# Patient Record
Sex: Female | Born: 1955 | Race: Black or African American | Hispanic: No | Smoking: Never smoker
Health system: Southern US, Community
[De-identification: ages and names within clinical notes are randomized; demographics above are authoritative.]

## PROBLEM LIST (undated history)

## (undated) DIAGNOSIS — G4733 Obstructive sleep apnea (adult) (pediatric): Secondary | ICD-10-CM

## (undated) DIAGNOSIS — I10 Essential (primary) hypertension: Secondary | ICD-10-CM

## (undated) DIAGNOSIS — K219 Gastro-esophageal reflux disease without esophagitis: Secondary | ICD-10-CM

## (undated) HISTORY — DX: Obstructive sleep apnea (adult) (pediatric): G47.33

## (undated) HISTORY — PX: TONSILLECTOMY: SUR1361

## (undated) HISTORY — DX: Essential (primary) hypertension: I10

---

## 1997-07-09 ENCOUNTER — Emergency Department (HOSPITAL_COMMUNITY): Admission: EM | Admit: 1997-07-09 | Discharge: 1997-07-09 | Payer: Self-pay | Admitting: Emergency Medicine

## 1997-10-24 ENCOUNTER — Ambulatory Visit (HOSPITAL_COMMUNITY): Admission: RE | Admit: 1997-10-24 | Discharge: 1997-10-24 | Payer: Self-pay | Admitting: Family Medicine

## 1997-12-25 ENCOUNTER — Ambulatory Visit (HOSPITAL_COMMUNITY): Admission: RE | Admit: 1997-12-25 | Discharge: 1997-12-25 | Payer: Self-pay | Admitting: Gastroenterology

## 1998-10-30 ENCOUNTER — Encounter: Payer: Self-pay | Admitting: Family Medicine

## 1998-10-30 ENCOUNTER — Ambulatory Visit (HOSPITAL_COMMUNITY): Admission: RE | Admit: 1998-10-30 | Discharge: 1998-10-30 | Payer: Self-pay | Admitting: Family Medicine

## 1998-12-14 ENCOUNTER — Other Ambulatory Visit: Admission: RE | Admit: 1998-12-14 | Discharge: 1998-12-14 | Payer: Self-pay | Admitting: Family Medicine

## 1999-10-15 ENCOUNTER — Encounter: Payer: Self-pay | Admitting: Family Medicine

## 1999-10-15 ENCOUNTER — Encounter: Admission: RE | Admit: 1999-10-15 | Discharge: 1999-10-15 | Payer: Self-pay | Admitting: Family Medicine

## 1999-11-13 ENCOUNTER — Encounter: Payer: Self-pay | Admitting: Family Medicine

## 1999-11-13 ENCOUNTER — Ambulatory Visit (HOSPITAL_COMMUNITY): Admission: RE | Admit: 1999-11-13 | Discharge: 1999-11-13 | Payer: Self-pay | Admitting: Family Medicine

## 1999-12-24 ENCOUNTER — Other Ambulatory Visit: Admission: RE | Admit: 1999-12-24 | Discharge: 1999-12-24 | Payer: Self-pay | Admitting: Family Medicine

## 2000-11-18 ENCOUNTER — Ambulatory Visit (HOSPITAL_COMMUNITY): Admission: RE | Admit: 2000-11-18 | Discharge: 2000-11-18 | Payer: Self-pay | Admitting: Family Medicine

## 2000-11-18 ENCOUNTER — Encounter: Payer: Self-pay | Admitting: Family Medicine

## 2000-12-22 ENCOUNTER — Other Ambulatory Visit: Admission: RE | Admit: 2000-12-22 | Discharge: 2000-12-22 | Payer: Self-pay | Admitting: Family Medicine

## 2001-11-24 ENCOUNTER — Ambulatory Visit (HOSPITAL_COMMUNITY): Admission: RE | Admit: 2001-11-24 | Discharge: 2001-11-24 | Payer: Self-pay | Admitting: Family Medicine

## 2001-11-24 ENCOUNTER — Encounter: Payer: Self-pay | Admitting: Family Medicine

## 2002-03-17 ENCOUNTER — Other Ambulatory Visit: Admission: RE | Admit: 2002-03-17 | Discharge: 2002-03-17 | Payer: Self-pay | Admitting: Family Medicine

## 2002-10-07 ENCOUNTER — Encounter: Payer: Self-pay | Admitting: Gastroenterology

## 2002-10-07 ENCOUNTER — Encounter: Admission: RE | Admit: 2002-10-07 | Discharge: 2002-10-07 | Payer: Self-pay | Admitting: Gastroenterology

## 2002-12-01 ENCOUNTER — Ambulatory Visit (HOSPITAL_COMMUNITY): Admission: RE | Admit: 2002-12-01 | Discharge: 2002-12-01 | Payer: Self-pay | Admitting: Family Medicine

## 2003-05-19 ENCOUNTER — Other Ambulatory Visit: Admission: RE | Admit: 2003-05-19 | Discharge: 2003-05-19 | Payer: Self-pay | Admitting: Family Medicine

## 2003-06-02 ENCOUNTER — Emergency Department (HOSPITAL_COMMUNITY): Admission: EM | Admit: 2003-06-02 | Discharge: 2003-06-02 | Payer: Self-pay | Admitting: Family Medicine

## 2003-12-04 ENCOUNTER — Ambulatory Visit (HOSPITAL_COMMUNITY): Admission: RE | Admit: 2003-12-04 | Discharge: 2003-12-04 | Payer: Self-pay | Admitting: Family Medicine

## 2004-01-22 ENCOUNTER — Emergency Department (HOSPITAL_COMMUNITY): Admission: EM | Admit: 2004-01-22 | Discharge: 2004-01-22 | Payer: Self-pay | Admitting: Emergency Medicine

## 2004-03-23 ENCOUNTER — Emergency Department (HOSPITAL_COMMUNITY): Admission: EM | Admit: 2004-03-23 | Discharge: 2004-03-23 | Payer: Self-pay | Admitting: Emergency Medicine

## 2004-08-14 ENCOUNTER — Other Ambulatory Visit: Admission: RE | Admit: 2004-08-14 | Discharge: 2004-08-14 | Payer: Self-pay | Admitting: Family Medicine

## 2004-09-04 ENCOUNTER — Emergency Department (HOSPITAL_COMMUNITY): Admission: EM | Admit: 2004-09-04 | Discharge: 2004-09-04 | Payer: Self-pay | Admitting: Emergency Medicine

## 2004-12-19 ENCOUNTER — Ambulatory Visit (HOSPITAL_COMMUNITY): Admission: RE | Admit: 2004-12-19 | Discharge: 2004-12-19 | Payer: Self-pay | Admitting: Family Medicine

## 2005-01-24 ENCOUNTER — Encounter: Admission: RE | Admit: 2005-01-24 | Discharge: 2005-01-24 | Payer: Self-pay | Admitting: Family Medicine

## 2005-02-03 ENCOUNTER — Emergency Department (HOSPITAL_COMMUNITY): Admission: EM | Admit: 2005-02-03 | Discharge: 2005-02-03 | Payer: Self-pay | Admitting: Emergency Medicine

## 2005-09-05 ENCOUNTER — Encounter: Admission: RE | Admit: 2005-09-05 | Discharge: 2005-09-05 | Payer: Self-pay | Admitting: Family Medicine

## 2006-03-23 ENCOUNTER — Ambulatory Visit (HOSPITAL_COMMUNITY): Admission: RE | Admit: 2006-03-23 | Discharge: 2006-03-23 | Payer: Self-pay | Admitting: Family Medicine

## 2006-04-08 ENCOUNTER — Encounter: Admission: RE | Admit: 2006-04-08 | Discharge: 2006-04-08 | Payer: Self-pay

## 2006-08-28 ENCOUNTER — Emergency Department (HOSPITAL_COMMUNITY): Admission: EM | Admit: 2006-08-28 | Discharge: 2006-08-29 | Payer: Self-pay | Admitting: Emergency Medicine

## 2006-08-31 ENCOUNTER — Observation Stay (HOSPITAL_COMMUNITY): Admission: EM | Admit: 2006-08-31 | Discharge: 2006-09-01 | Payer: Self-pay | Admitting: Emergency Medicine

## 2006-08-31 ENCOUNTER — Encounter (INDEPENDENT_AMBULATORY_CARE_PROVIDER_SITE_OTHER): Payer: Self-pay | Admitting: Emergency Medicine

## 2007-03-26 ENCOUNTER — Ambulatory Visit (HOSPITAL_COMMUNITY): Admission: RE | Admit: 2007-03-26 | Discharge: 2007-03-26 | Payer: Self-pay | Admitting: *Deleted

## 2008-03-27 ENCOUNTER — Ambulatory Visit (HOSPITAL_COMMUNITY): Admission: RE | Admit: 2008-03-27 | Discharge: 2008-03-27 | Payer: Self-pay | Admitting: Family Medicine

## 2008-08-01 IMAGING — CR DG CHEST 1V PORT
1 series · 1 of 1 positions shown · non-contrast
Comparison: none

CLINICAL DATA: Chest pain.  Hypertension. 
 PORTABLE CHEST ? 1 VIEW ? 08/31/06:

[view not recorded]
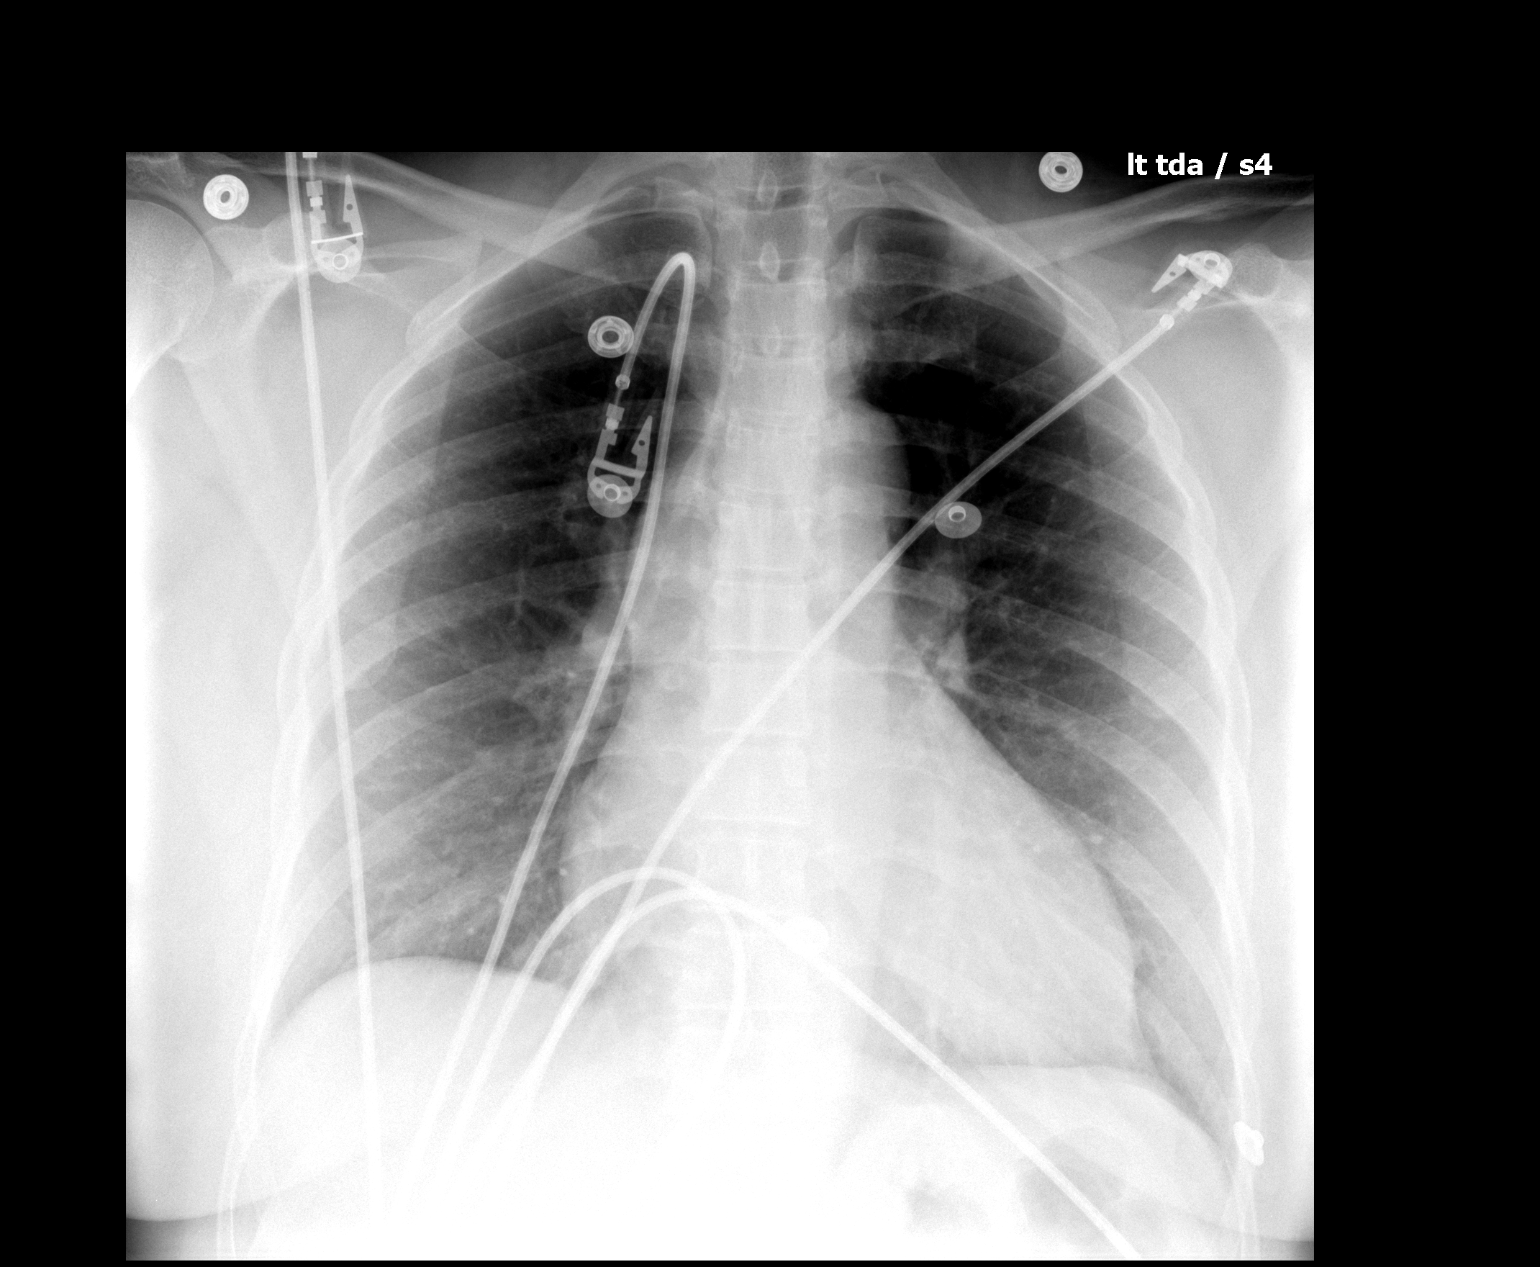

[1 of 1 positions shown; findings below may reference images not displayed]

FINDINGS: Heart size is normal considering AP portable technique.  Vascularity is normal and the lungs are clear.  No bony abnormality.
IMPRESSION: No acute disease.

## 2009-04-02 ENCOUNTER — Ambulatory Visit (HOSPITAL_COMMUNITY): Admission: RE | Admit: 2009-04-02 | Discharge: 2009-04-02 | Payer: Self-pay | Admitting: Family Medicine

## 2009-09-19 ENCOUNTER — Encounter: Admission: RE | Admit: 2009-09-19 | Discharge: 2009-09-19 | Payer: Self-pay | Admitting: Family Medicine

## 2010-03-26 ENCOUNTER — Other Ambulatory Visit (HOSPITAL_COMMUNITY): Payer: Self-pay | Admitting: Family Medicine

## 2010-03-26 DIAGNOSIS — Z1231 Encounter for screening mammogram for malignant neoplasm of breast: Secondary | ICD-10-CM

## 2010-04-04 ENCOUNTER — Ambulatory Visit (HOSPITAL_COMMUNITY): Payer: Self-pay

## 2010-04-05 ENCOUNTER — Ambulatory Visit (HOSPITAL_COMMUNITY): Payer: Self-pay

## 2010-04-05 ENCOUNTER — Ambulatory Visit (HOSPITAL_COMMUNITY)
Admission: RE | Admit: 2010-04-05 | Discharge: 2010-04-05 | Disposition: A | Discharge status: Home / Self Care | Payer: 59 | Source: Ambulatory Visit | Attending: Family Medicine | Admitting: Family Medicine

## 2010-04-05 DIAGNOSIS — Z1231 Encounter for screening mammogram for malignant neoplasm of breast: Secondary | ICD-10-CM | POA: Insufficient documentation

## 2010-06-18 NOTE — H&P (Signed)
[name], [name]             ACCOUNT NO.:  [id number]   MEDICAL RECORD NO.:  [id number]          PATIENT TYPE:  INP   LOCATION:  2039                         FACILITY:  [facility]   PHYSICIAN:  Hind I Elsaid, MD      DATE OF BIRTH:  1955-12-30   DATE OF ADMISSION:  08/31/2006  DATE OF DISCHARGE:                              HISTORY & PHYSICAL   PRIMARY CARE PHYSICIAN:  Dr. Smith Mince.   CHIEF COMPLAINT:  Chest pain.   HISTORY OF PRESENT ILLNESS:  A 55 year old African American female with  a history of uncontrolled blood pressure, with last visit 3 days ago  secondary to elevated blood pressures where the patient was evaluated by  the ED physician and recommended some medications, and was sent home.  The patient today with a main complaint of bilateral chest pain, mainly  on the right side with radiating to the right shoulder.  Pain 5/10,  popping in nature.  The patient denies any nausea or vomiting, any  sweating or palpitations with the chest pain.  The patient's pain  completely resolved while in the ED.  The patient denies any similar  episode of chest pain in the past.  Pain is not associated with  shortness of breath.  The patient denies any change in bowel habit.  Denies any abdominal pain or back pain.  Denies any lower extremity  weakness or numbness.  Condition was associated with headache.  Also,  the patient mentioned that she measured her blood pressure at home today  and it was elevated above 190, and she was referred to come to the  hospital for further evaluation of her blood pressure and chest pain.   PAST MEDICAL HISTORY:  1. History of hypertension.  2. Gastroesophageal reflux disease.  3. Allergic sinusitis.   MEDICATIONS:  1. Benicar 40/25 1 tab p.o. daily.  2. Potassium chloride 10 mEq p.o. daily.  3. Nexium 40 mg p.o. daily.  4. Nasonex 1 puff up the nose b.i.d.   ALLERGIES:  No known drug allergies.   PAST SURGICAL HISTORY:  C-section.   REVIEW  OF SYSTEMS:  Some episode of headache but not today.  No blurring  of vision.  No numbness or weakness of her  extremities.  Denies any  nausea or vomiting.  Denies any burning with urination.  Chest pain at  this time is completely resolved.   FAMILY HISTORY:  Positive for a massive heart attack of her mother at  age 47.  Father died because of emphysema and a history of hypertension.   SOCIAL HISTORY:  Lives by herself.  She is divorced, works at the court  house.  Has a daughter, age 43, and she is healthy.  A history of  excess smoking for more than 20 years ago.  She drinks wine, 1 glass  everyday, and no alcohol.   PHYSICAL EXAMINATION:  VITAL SIGNS:  Temperature 98.4, blood pressure  174/100, pulse rate 74, respiratory rate 18, and sating 96% on room air.  GENERAL:  The patient is alert and oriented, and not in respiratory  distress or shortness  of breath.  Not in pain.  HEENT:  Normocephalic, atraumatic.  Pupils equal and reactive to light  and accommodation.  Extraocular muscles movement was normal.  Throat was  normal.  NECK:  No carotid bruit, no JVD, and no thyromegaly.  CHEST:  Normal work of breathing.  Equal air entry.  Bilateral breaths  was normal.  There is no masses, and no area of tenderness. Nipples are  everted.  HEART:  S1 and S2 with no other sound.  No murmur, no gallop.  ABDOMEN:  Soft, nontender.  Bowel sounds positive, and no organomegaly.  EXTREMITIES:  Peripheral pulses intact, and there is no lower limb  edema, and there is no evidence of cyanosis.  BACK:  No area of point tenderness, and no sacral ulcers.  NEUROLOGIC:  The patient is alert and oriented x3 with no focal  neurological findings.   LABORATORY DATA:  Cardiac enzymes x1 was negative.  Sodium 140,  potassium 3.1, carbon dioxide 28, chloride 104, glucose 109, BUN 17,  creatinine 0.79, calcium 9.8.  White blood cells 6.6, hemoglobin 12.3,  hematocrit 37.6, platelets 187.  EKG:  Normal sinus  rhythm.   ASSESSMENT AND PLAN:  1. Chest pain:  It looks like atypical chest pain on presentation.  We      will admit the patient to telemetry.  Cycle enzymes.  2-D echo, and      repeat EKG.  The patient may need to have a stress test as an      outpatient if the above was negative.  Chest pain could be related      to muscular, or could be related to the hypertension.  We will      order a chest x-ray for further evaluation of the chest.  2. Uncontrolled hypertension:  We will add beta blockers to the above      medications, and further recommendation as inpatient in the      hospital.  Deep venous thrombosis and gastrointestinal prophylaxis.      Hind Bosie Helper, MD  Electronically Signed     HIE/MEDQ  D:  08/31/2006  T:  08/31/2006  Job:  [id number]

## 2010-06-18 NOTE — Discharge Summary (Signed)
NAMELADONNA, [name]             ACCOUNT NO.:  [id number]   MEDICAL RECORD NO.:  [id number]          PATIENT TYPE:  INP   LOCATION:  2039                         FACILITY:  [facility]   PHYSICIAN:  Ladell Pier, M.D.   DATE OF BIRTH:  July 25, 1955   DATE OF ADMISSION:  08/31/2006  DATE OF DISCHARGE:  09/01/2006                               DISCHARGE SUMMARY   DISCHARGE DIAGNOSES:  1. Atypical chest pain with negative cardiac enzymes, normal EKG,      negative D-dimer.  2. Hypertension.  3. Gastroesophageal reflux disease.  4. Allergic rhinitis.   DISCHARGE MEDICATIONS:  1. Avapro 300 mg daily.  2. Hydrochlorothiazide 25 mg daily.  3. Nexium 40 mg daily.  4. Nasonex one puff b.i.d.  5. Xyzal daily.   FOLLOW-UP APPOINTMENTS:  The patient has an appointment for a treadmill  stress test with Dr. Katrinka Blazing on September 09, 2006, at 8:30, and follow up  with Dr. Smith Mince in 1 week.   CONSULTANTS:  None.   PROCEDURES:  None.   HISTORY OF PRESENT ILLNESS:  The patient is a 51-year African American  female, history of uncontrolled blood pressure with last visit three  days ago secondary to elevated blood pressure in the emergency room. ED  recommended some medications and the patient was sent home.  She came  back complaining of bilateral chest pain on the right side radiating  down to the right shoulder.  Please see remainder of H&P for history,  past medical history, family history, social history, meds, allergies,  review of systems per admission H&P.   PHYSICAL EXAMINATION ON DISCHARGE:  VITAL SIGNS:  Temperature 97.2,  pulse 72, respirations 20, blood pressure 125/82, pulse ox 97% on room  air.  HEENT:  Head is normocephalic, atraumatic.  Pupils reactive to light.  Throat without erythema.  CARDIOVASCULAR:  Regular rate and rhythm.  LUNGS:  Clear bilaterally.  ABDOMEN:  Positive bowel sounds.  EXTREMITIES:  Without edema.   HOSPITAL COURSE:  PROBLEM #1 - CHEST PAIN:  The  patient was admitted to  the hospital.  Her enzymes were cycled.  She had an EKG done.  They were  normal.  She also had D-dimer checked that was normal.  Thyroid function  check that was normal.  The patient was discharged home.  She will  follow up with her primary care physician.  She had a 2-D echo done; at  the time of the discharge, it was still pending.  She also has an  appointment to follow up for a treadmill stress test with Dr. Halina Andreas [facility] on September 09, 2006, at 8:30 a.m.   PROBLEM #2 - HYPERTENSION:  The patient was on  Benicar/hydrochlorothiazide.  She was on Avapro/hydrochlorothiazide in  the hospital secondary to the hospital formulary.  She preferred to be  on the Avapro so I gave her a prescription for Avapro and the  hydrochlorothiazide.  Blood pressure on discharge was 125/82.   DISCHARGE LABORATORY DATA:  Total cholesterol 191, triglycerides of 119,  HDL 51, LDL 116.  Cardiac enzymes all normal.  Sodium 138, potassium  3.5, chloride 101, CO2 29, glucose 102, BUN 13, creatinine 0.79.  TSH  1.137.  Hemoglobin A1c 6.  D-dimer 0.33.   Chest x-ray showed no acute disease.      Ladell Pier, M.D.  Electronically Signed     NJ/MEDQ  D:  09/01/2006  T:  09/02/2006  Job:  [id number]   cc:   Talmadge Coventry, M.D.  Lyn Records, M.D.

## 2010-11-18 LAB — CK TOTAL AND CKMB (NOT AT ARMC)
CK, MB: 3.1
CK, MB: 3.7
Relative Index: 2
Relative Index: INVALID
Total CK: 153
Total CK: 177

## 2010-11-18 LAB — BASIC METABOLIC PANEL
CO2: 28
Calcium: 9.7
Chloride: 104
GFR calc Af Amer: >60
GFR calc non Af Amer: >60
GFR calc non Af Amer: >60
Glucose, Bld: 109 — ABNORMAL HIGH
Potassium: 3.1 — ABNORMAL LOW
Potassium: 3.5
Sodium: 138
Sodium: 140

## 2010-11-18 LAB — CBC
HCT: 37.6
Hemoglobin: 12.3
RDW: 14.5 — ABNORMAL HIGH

## 2010-11-18 LAB — COMPREHENSIVE METABOLIC PANEL
ALT: 19
AST: 20
Alkaline Phosphatase: 122 — ABNORMAL HIGH
Calcium: 9.3
GFR calc Af Amer: >60
Potassium: 3.7
Sodium: 136
Total Protein: 7.3

## 2010-11-18 LAB — POCT CARDIAC MARKERS
CKMB, poc: 1
CKMB, poc: <1 — ABNORMAL LOW
Myoglobin, poc: 42
Troponin i, poc: <0.05

## 2010-11-18 LAB — DIFFERENTIAL
Basophils Absolute: 0
Eosinophils Relative: 1
Lymphs Abs: 1.7
Monocytes Relative: 11
Neutro Abs: 4.1

## 2010-11-18 LAB — TROPONIN I: Troponin I: 0.01

## 2010-11-18 LAB — LIPID PANEL
HDL: 51
Total CHOL/HDL Ratio: 3.7
VLDL: 24

## 2010-11-18 LAB — B-NATRIURETIC PEPTIDE (CONVERTED LAB): Pro B Natriuretic peptide (BNP): 33

## 2010-11-18 LAB — PHOSPHORUS: Phosphorus: 3.7

## 2010-11-18 LAB — HEMOGLOBIN A1C: Hgb A1c MFr Bld: 6

## 2010-11-18 LAB — MAGNESIUM: Magnesium: 2

## 2011-03-01 ENCOUNTER — Emergency Department (HOSPITAL_COMMUNITY)
Admission: EM | Admit: 2011-03-01 | Discharge: 2011-03-01 | Disposition: A | Discharge status: Home / Self Care | Payer: 59 | Attending: Emergency Medicine | Admitting: Emergency Medicine

## 2011-03-01 ENCOUNTER — Encounter (HOSPITAL_COMMUNITY): Payer: Self-pay | Admitting: Physical Medicine and Rehabilitation

## 2011-03-01 ENCOUNTER — Emergency Department (HOSPITAL_COMMUNITY): Payer: 59

## 2011-03-01 ENCOUNTER — Other Ambulatory Visit: Payer: Self-pay

## 2011-03-01 DIAGNOSIS — R079 Chest pain, unspecified: Secondary | ICD-10-CM | POA: Insufficient documentation

## 2011-03-01 DIAGNOSIS — R141 Gas pain: Secondary | ICD-10-CM | POA: Insufficient documentation

## 2011-03-01 DIAGNOSIS — Z79899 Other long term (current) drug therapy: Secondary | ICD-10-CM | POA: Insufficient documentation

## 2011-03-01 DIAGNOSIS — R143 Flatulence: Secondary | ICD-10-CM | POA: Insufficient documentation

## 2011-03-01 DIAGNOSIS — R1012 Left upper quadrant pain: Secondary | ICD-10-CM | POA: Insufficient documentation

## 2011-03-01 DIAGNOSIS — R142 Eructation: Secondary | ICD-10-CM | POA: Insufficient documentation

## 2011-03-01 LAB — CBC
HCT: 39.5 % (ref 36.0–46.0)
MCHC: 33.9 g/dL (ref 30.0–36.0)
Platelets: 157 10*3/uL (ref 150–400)
RDW: 14.2 % (ref 11.5–15.5)
WBC: 6.9 10*3/uL (ref 4.0–10.5)

## 2011-03-01 LAB — TROPONIN I: Troponin I: <0.3 ng/mL (ref <0.30)

## 2011-03-01 LAB — BASIC METABOLIC PANEL
BUN: 9 mg/dL (ref 6–23)
GFR calc Af Amer: >90 mL/min (ref >90)
GFR calc non Af Amer: >90 mL/min (ref >90)
Potassium: 3.4 mEq/L — ABNORMAL LOW (ref 3.5–5.1)
Sodium: 142 mEq/L (ref 135–145)

## 2011-03-01 NOTE — ED Provider Notes (Signed)
History     CSN: [id number]  Arrival date & time 03/01/11  0840   First MD Initiated Contact with Patient 03/01/11 [phone]      Chief Complaint  Patient presents with  . Chest Pain     The history is provided by the patient.   the patient reports she's had constipation for about a week and a half.  She called her primary care physician who prescribed her a stool softener.  She reports she's had some improvement in her bowel movements but since starting the stool soft no she's continued to have intermittent discomfort in her upper abdomen that sometimes radiates up into her chest.  Her pain is intermittent it lasts 20 or 30 minutes.  There is no associated diaphoresis nausea vomiting or shortness of breath.  She reports left-sided discomfort came on and was located in her left upper abdomen and radiated towards her left back.  She had no pain in her shoulders or jaw R. chest at that time.  She reports she walked around her house and seemed to improve her pain.  She reports she's had a lot of flatus as of recently.  Sometimes passing flatus will improve her symptoms.  She has a history of hypertension.  She's compliant with her medications.  She is not a smoker she does not have diabetes and does not have high cholesterol.  She has no family history of coronary artery disease.  PMHx: no signigicant  Psurg hx: none  Family hx: no cardiac disease. HTN  Social hx: prior tobacco, no drug use. No ETOH  OB History    Grav Para Term Preterm Abortions TAB SAB Ect Mult Living                  Review of Systems  Cardiovascular: Positive for chest pain.  All other systems reviewed and are negative.    Allergies  Review of patient's allergies indicates no known allergies.  Home Medications   Current Outpatient Rx  Name Route Sig Dispense Refill  . BIOTIN 5 MG PO CAPS Oral Take 1 capsule by mouth daily.    Marland Kitchen CARVEDILOL 6.25 MG PO TABS Oral Take 6.25 mg by mouth 2 (two) times daily with a  meal.    . DOCUSATE SODIUM 100 MG PO CAPS Oral Take 100 mg by mouth 2 (two) times daily.    Marland Kitchen ESOMEPRAZOLE MAGNESIUM 40 MG PO CPDR Oral Take 40 mg by mouth daily before breakfast.    . LOSARTAN POTASSIUM-HCTZ 100-25 MG PO TABS Oral Take 1 tablet by mouth daily.      BP 204/107  Pulse 77  Temp(Src) 98.2 F (36.8 C) (Oral)  Resp 17  SpO2 100%  Physical Exam  Nursing note and vitals reviewed. Constitutional: She is oriented to person, place, and time. She appears well-developed and well-nourished. No distress.  HENT:  Head: Normocephalic and atraumatic.  Eyes: EOM are normal.  Neck: Normal range of motion.  Cardiovascular: Normal rate, regular rhythm and normal heart sounds.   Pulmonary/Chest: Effort normal and breath sounds normal.  Abdominal: Soft. She exhibits no distension. There is no tenderness.  Musculoskeletal: Normal range of motion.  Neurological: She is alert and oriented to person, place, and time.  Skin: Skin is warm and dry.  Psychiatric: She has a normal mood and affect. Judgment normal.    ED Course  Procedures (including critical care time)   Date: 03/01/2011  Rate: 81  Rhythm: normal sinus rhythm  QRS Axis:  normal  Intervals: normal  ST/T Wave abnormalities: normal  Conduction Disutrbances:none  Narrative Interpretation:   Old EKG Reviewed: No significant changes noted     Labs Reviewed  CBC - Abnormal; Notable for the following:    RBC 5.35 (*)    MCV 73.8 (*)    MCH 25.0 (*)    All other components within normal limits  BASIC METABOLIC PANEL - Abnormal; Notable for the following:    Potassium 3.4 (*)    Glucose, Bld 108 (*)    All other components within normal limits  TROPONIN I   Dg Chest 2 View  03/01/2011  *RADIOLOGY REPORT*  Clinical Data: Chest pain  CHEST - 2 VIEW  Comparison: 09/19/2009  Findings: The heart size and mediastinal contours are within normal limits.  Both lungs are clear.  The visualized skeletal structures are  unremarkable.  IMPRESSION: Negative exam.  Original Report Authenticated By: Rosealee Albee, M.D.     1. Chest pain       MDM  Her symptoms sound more GI related.  We'll obtain labs and chest x-ray.  Her EKG is normal.  Her blood pressure is elevated in the emergency department however this will be rechecked.  She seems slightly anxious nothing this may be stress related.  She reports compliance with her blood pressure medications  10:38 AM Patient continues to be chest pain-free.  Discharge home in good condition.       Lyanne Co, MD 03/01/11 1039

## 2011-03-01 NOTE — ED Notes (Signed)
Patient undressed and changed into gown. Cardiac monitor, pulse ox, and bp cuff on.

## 2011-03-01 NOTE — ED Notes (Signed)
Pt moved to CDU 2. Report given to Marylu Lund, [place].

## 2011-03-01 NOTE — ED Notes (Signed)
Received report from [facility]. Pt being moved to cdu#2.

## 2011-03-01 NOTE — ED Notes (Signed)
Pt. Stated, I've been having chest discomfort on my chest my sides of my chest and all areas around my chest for 2 weeks

## 2011-03-01 NOTE — ED Notes (Signed)
Received pt  In cdu#2, pt given ice chips per pt request.

## 2011-03-01 NOTE — ED Notes (Addendum)
Pt presents to department for evaluation of diffuse chest discomfort. Pt states intermittent x2 weeks. Was seen by PCP for constipation, and prescribed laxative. Pt states no relief from pain at home. No nausea/vomiting. Respirations unlabored. Lung sounds clear and equal bilaterally.  She is alert and oriented x4. Denies pain at the time. Skin warm and dry. No signs of distress at the present.

## 2011-03-03 ENCOUNTER — Other Ambulatory Visit (HOSPITAL_COMMUNITY): Payer: Self-pay | Admitting: Family Medicine

## 2011-03-03 DIAGNOSIS — Z1231 Encounter for screening mammogram for malignant neoplasm of breast: Secondary | ICD-10-CM

## 2011-03-27 ENCOUNTER — Other Ambulatory Visit (HOSPITAL_COMMUNITY): Payer: Self-pay | Admitting: Family Medicine

## 2011-03-27 DIAGNOSIS — Z1231 Encounter for screening mammogram for malignant neoplasm of breast: Secondary | ICD-10-CM

## 2011-04-22 ENCOUNTER — Ambulatory Visit (HOSPITAL_COMMUNITY)
Admission: RE | Admit: 2011-04-22 | Discharge: 2011-04-22 | Disposition: A | Discharge status: Home / Self Care | Payer: 59 | Source: Ambulatory Visit | Attending: Family Medicine | Admitting: Family Medicine

## 2011-04-22 DIAGNOSIS — Z1231 Encounter for screening mammogram for malignant neoplasm of breast: Secondary | ICD-10-CM | POA: Insufficient documentation

## 2012-04-19 ENCOUNTER — Other Ambulatory Visit (HOSPITAL_COMMUNITY): Payer: Self-pay | Admitting: Family Medicine

## 2012-04-23 ENCOUNTER — Ambulatory Visit (HOSPITAL_COMMUNITY)
Admission: RE | Admit: 2012-04-23 | Discharge: 2012-04-23 | Disposition: A | Discharge status: Home / Self Care | Payer: 59 | Source: Ambulatory Visit | Attending: Family Medicine | Admitting: Family Medicine

## 2012-04-23 DIAGNOSIS — Z1231 Encounter for screening mammogram for malignant neoplasm of breast: Secondary | ICD-10-CM | POA: Insufficient documentation

## 2013-01-30 IMAGING — CR DG CHEST 2V
2 series · 2 of 2 positions shown · non-contrast
Comparison: 09/19/2009

CLINICAL DATA: Chest pain

CHEST - 2 VIEW

[w chest pa]
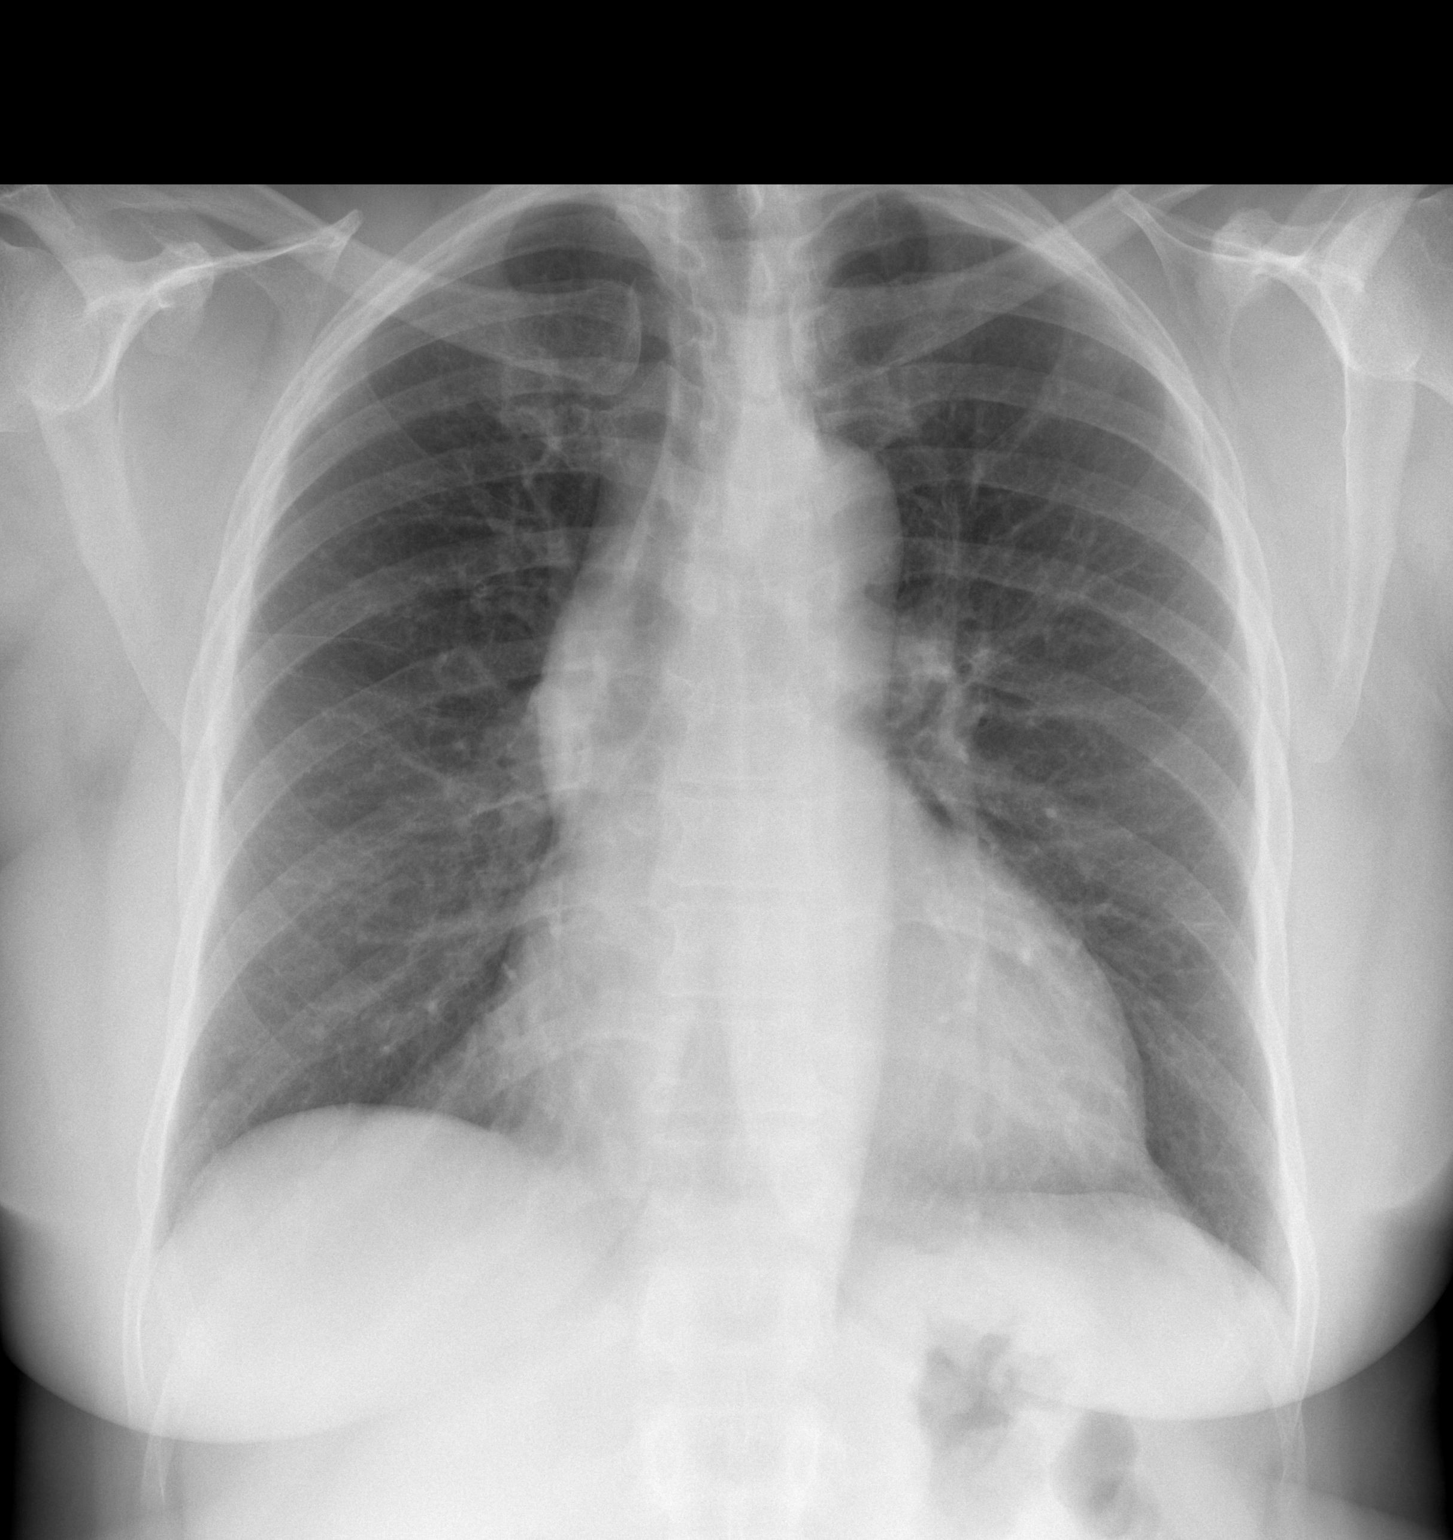

[w chest lat]
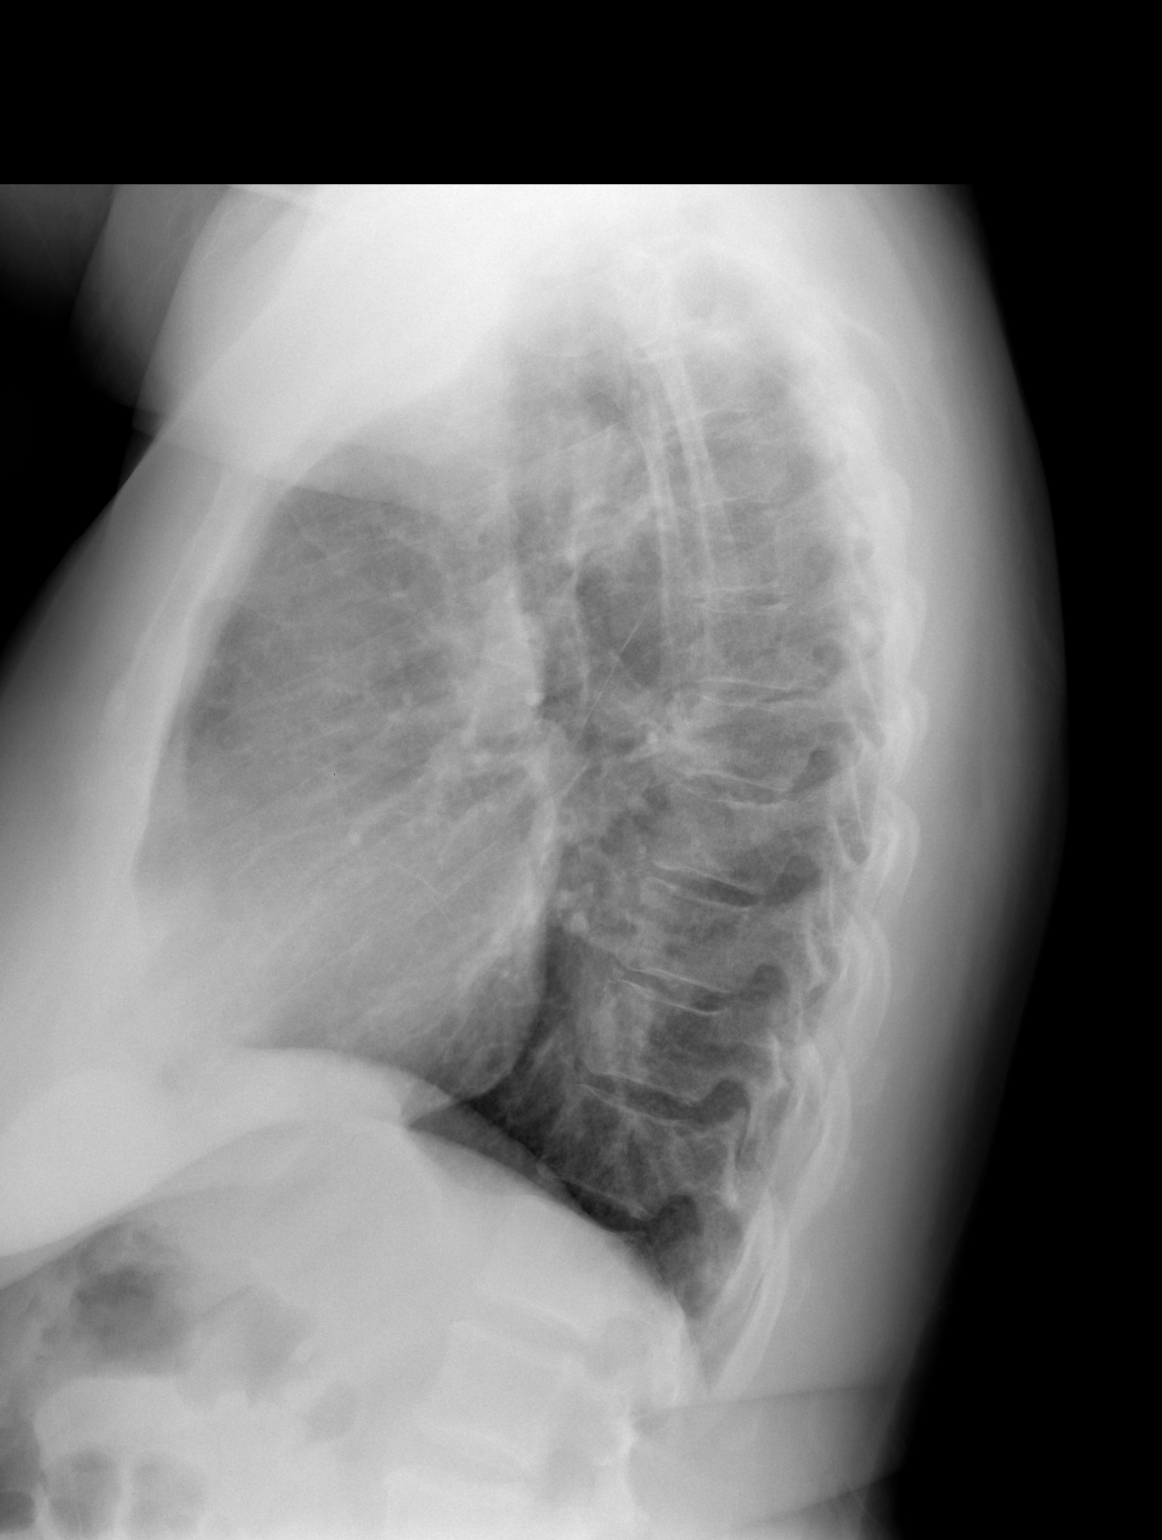

[2 of 2 positions shown; findings below may reference images not displayed]

FINDINGS: The heart size and mediastinal contours are within normal
limits.  Both lungs are clear.  The visualized skeletal structures
are unremarkable.
IMPRESSION: Negative exam.

## 2013-04-08 ENCOUNTER — Other Ambulatory Visit (HOSPITAL_COMMUNITY): Payer: Self-pay | Admitting: Family Medicine

## 2013-04-08 DIAGNOSIS — Z1231 Encounter for screening mammogram for malignant neoplasm of breast: Secondary | ICD-10-CM

## 2013-04-29 ENCOUNTER — Ambulatory Visit (HOSPITAL_COMMUNITY)
Admission: RE | Admit: 2013-04-29 | Discharge: 2013-04-29 | Disposition: A | Discharge status: Home / Self Care | Payer: 59 | Source: Ambulatory Visit | Attending: Family Medicine | Admitting: Family Medicine

## 2013-04-29 DIAGNOSIS — Z1231 Encounter for screening mammogram for malignant neoplasm of breast: Secondary | ICD-10-CM | POA: Insufficient documentation

## 2014-05-05 ENCOUNTER — Other Ambulatory Visit (HOSPITAL_COMMUNITY): Payer: Self-pay | Admitting: Family Medicine

## 2014-05-05 DIAGNOSIS — Z1231 Encounter for screening mammogram for malignant neoplasm of breast: Secondary | ICD-10-CM

## 2014-05-16 ENCOUNTER — Ambulatory Visit (HOSPITAL_COMMUNITY)
Admission: RE | Admit: 2014-05-16 | Discharge: 2014-05-16 | Disposition: A | Discharge status: Home / Self Care | Payer: 59 | Source: Ambulatory Visit | Attending: Family Medicine | Admitting: Family Medicine

## 2014-05-16 DIAGNOSIS — Z1231 Encounter for screening mammogram for malignant neoplasm of breast: Secondary | ICD-10-CM | POA: Diagnosis present

## 2015-03-07 ENCOUNTER — Other Ambulatory Visit: Payer: Self-pay

## 2015-03-07 DIAGNOSIS — Z1231 Encounter for screening mammogram for malignant neoplasm of breast: Secondary | ICD-10-CM

## 2015-05-18 ENCOUNTER — Ambulatory Visit: Payer: 59

## 2015-05-25 ENCOUNTER — Ambulatory Visit
Admission: RE | Admit: 2015-05-25 | Discharge: 2015-05-25 | Disposition: A | Discharge status: Home / Self Care | Payer: 59 | Source: Ambulatory Visit

## 2015-05-25 DIAGNOSIS — Z1231 Encounter for screening mammogram for malignant neoplasm of breast: Secondary | ICD-10-CM

## 2016-04-16 IMAGING — MG MM DIGITAL SCREENING BILAT
3 series · 3 of 3 positions shown · non-contrast
Comparison: Previous exam(s).

CLINICAL DATA: Screening.

EXAM:
DIGITAL SCREENING BILATERAL MAMMOGRAM WITH CAD

[R MLO]
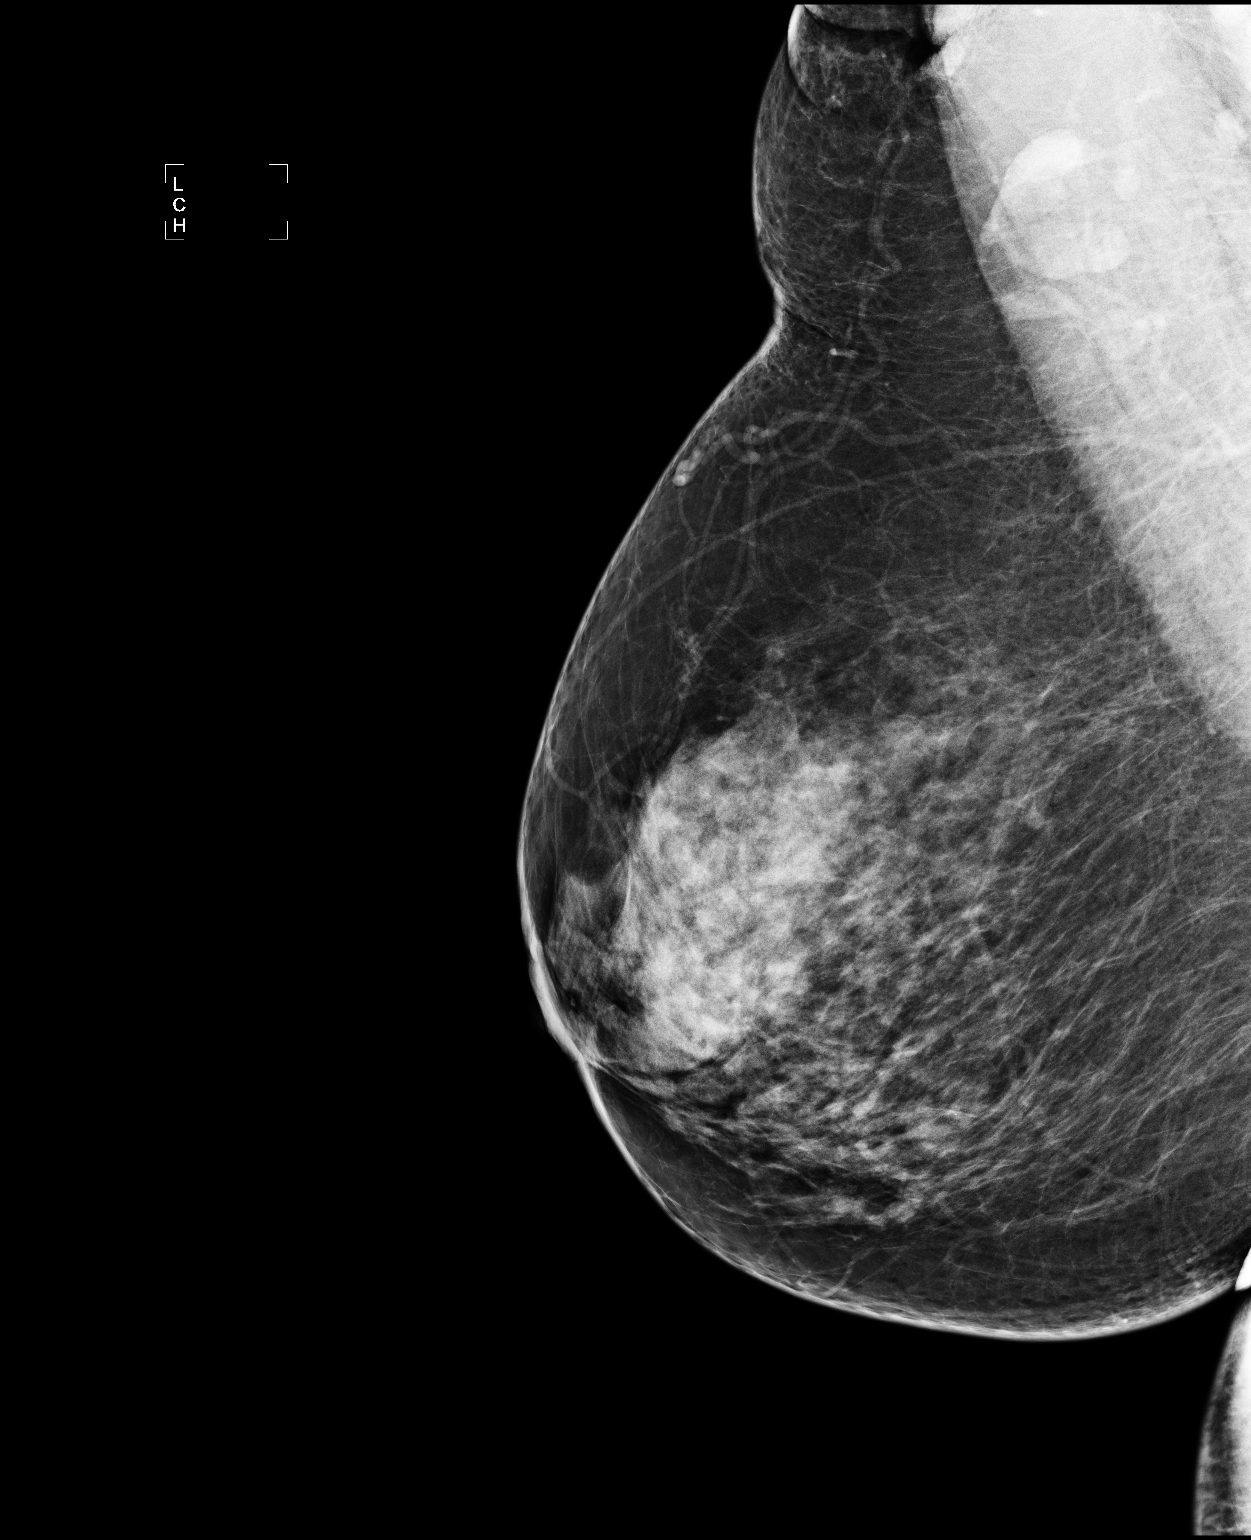

[L CC]
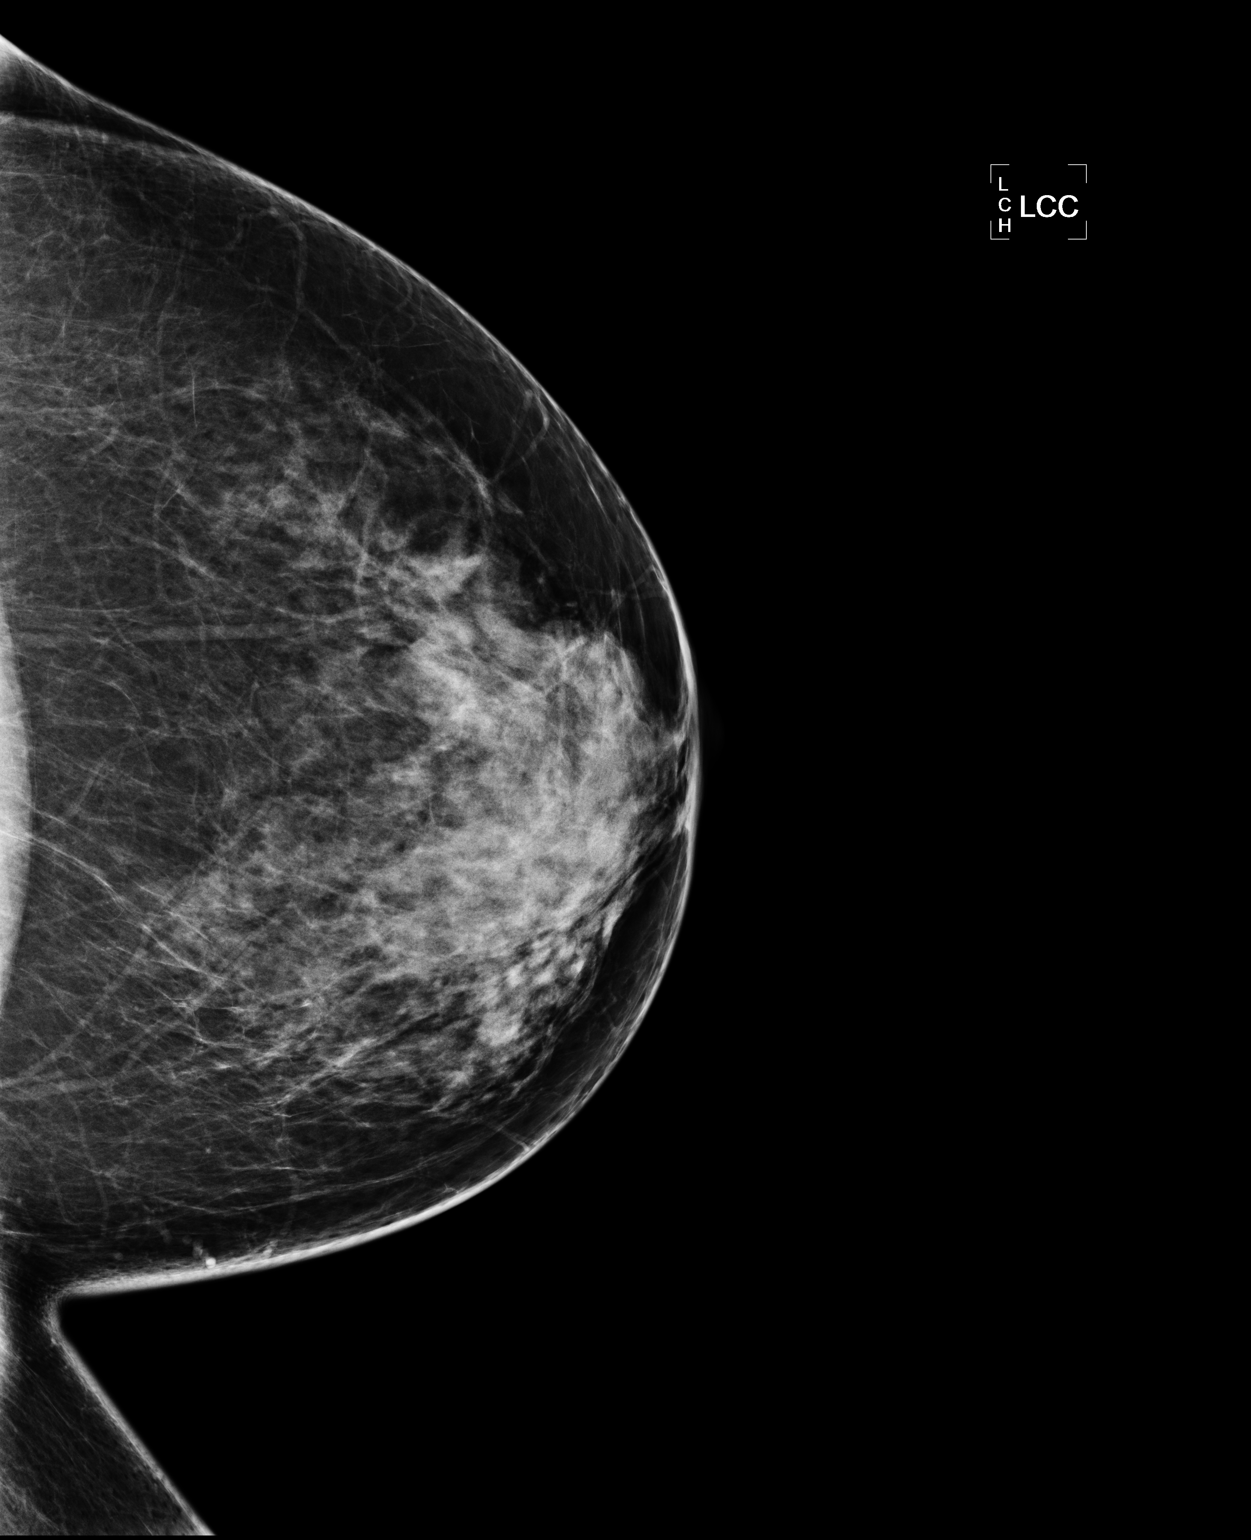

[L MLO]
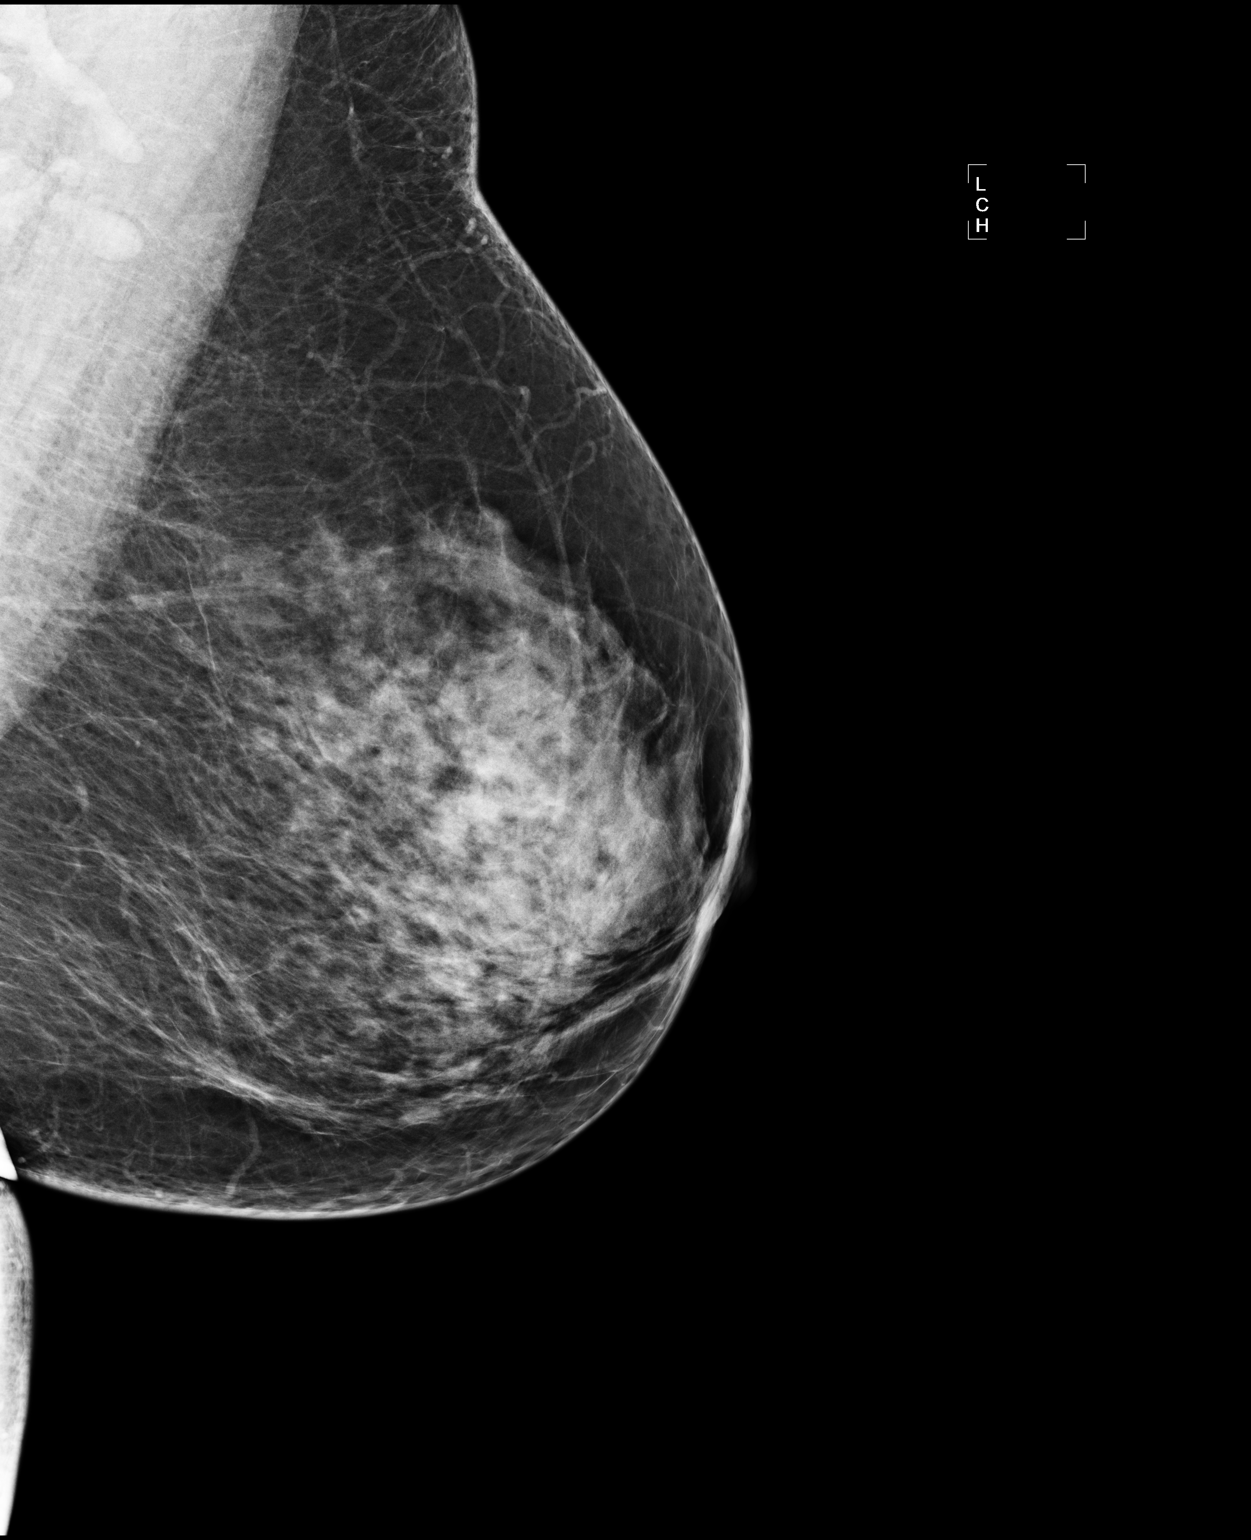

[3 of 3 positions shown; findings below may reference images not displayed]

ACR Breast Density Category c: The breast tissue is heterogeneously
dense, which may obscure small masses.
FINDINGS: There are no findings suspicious for malignancy. Images were
processed with CAD.
IMPRESSION: No mammographic evidence of malignancy. A result letter of this
screening mammogram will be mailed directly to the patient.

RECOMMENDATION:
Screening mammogram in one year. (Code:YJ-2-FEZ)

BI-RADS CATEGORY  1: Negative.

## 2016-06-17 ENCOUNTER — Other Ambulatory Visit: Payer: Self-pay | Admitting: Family Medicine

## 2016-06-17 DIAGNOSIS — Z1231 Encounter for screening mammogram for malignant neoplasm of breast: Secondary | ICD-10-CM

## 2016-07-02 ENCOUNTER — Ambulatory Visit
Admission: RE | Admit: 2016-07-02 | Discharge: 2016-07-02 | Disposition: A | Discharge status: Home / Self Care | Payer: 59 | Source: Ambulatory Visit | Attending: Family Medicine | Admitting: Family Medicine

## 2016-07-02 DIAGNOSIS — Z1231 Encounter for screening mammogram for malignant neoplasm of breast: Secondary | ICD-10-CM

## 2017-05-05 ENCOUNTER — Other Ambulatory Visit: Payer: Self-pay | Admitting: Family Medicine

## 2017-05-05 DIAGNOSIS — Z1231 Encounter for screening mammogram for malignant neoplasm of breast: Secondary | ICD-10-CM

## 2017-05-18 ENCOUNTER — Institutional Professional Consult (permissible substitution): Payer: 59 | Admitting: Pulmonary Disease

## 2017-05-29 ENCOUNTER — Ambulatory Visit: Payer: 59 | Admitting: Pulmonary Disease

## 2017-05-29 ENCOUNTER — Encounter: Payer: Self-pay | Admitting: Pulmonary Disease

## 2017-05-29 VITALS — BP 110/70 | HR 77 | Ht 64.0 in | Wt 181.0 lb

## 2017-05-29 DIAGNOSIS — R0683 Snoring: Secondary | ICD-10-CM | POA: Diagnosis not present

## 2017-05-29 DIAGNOSIS — G4719 Other hypersomnia: Secondary | ICD-10-CM | POA: Diagnosis not present

## 2017-05-29 NOTE — Progress Notes (Signed)
   Subjective:    Patient ID: [name], female    DOB: 04/03/1955, 62 y.o.   MRN: [id number]  HPI    Review of Systems  Constitutional: Negative for fever and unexpected weight change.  HENT: Negative for congestion, dental problem, ear pain, nosebleeds, postnasal drip, rhinorrhea, sinus pressure, sneezing, sore throat and trouble swallowing.   Eyes: Negative for redness and itching.  Respiratory: Negative for cough, chest tightness, shortness of breath and wheezing.   Cardiovascular: Negative for palpitations and leg swelling.  Gastrointestinal: Negative for nausea and vomiting.  Genitourinary: Negative for dysuria.  Musculoskeletal: Negative for joint swelling.  Skin: Negative for rash.  Allergic/Immunologic: Negative.  Negative for environmental allergies, food allergies and immunocompromised state.  Neurological: Positive for headaches.  Hematological: Does not bruise/bleed easily.  Psychiatric/Behavioral: Negative for dysphoric mood. The patient is not nervous/anxious.        Objective:   Physical Exam        Assessment & Plan:

## 2017-05-29 NOTE — Patient Instructions (Signed)
Will arrange for home sleep study Will call to arrange for follow up after sleep study reviewed  

## 2017-05-29 NOTE — Progress Notes (Signed)
[facility]  Chief Complaint  Patient presents with  . Consult    Snoring and family members have noted she has apneic periods during thr night.     Vital signs: BP 110/70 (BP Location: Left Arm, Cuff Size: Normal)   Pulse 77   Ht 5\' 4"  (1.626 m)   Wt 181 lb (82.1 kg)   SpO2 97%   BMI 31.07 kg/m   History of Present Illness: [name] is a 62 y.o. female for evaluation of sleep problems.  Her daughter has been worried about her snoring, and says she stops breathing at night.  She has trouble sleeping on her back, and gets dry mouth at night.  She falls asleep easily when watching TV.  She is a restless sleeper.  She gets teeth grinding and pain in her TMJ intermittently.  She goes to sleep at 11 pm.  She falls asleep after several minutes.  She wakes up 1 or 2 times to use the bathroom.  She gets out of bed at 8 am.  She feels okay in the morning.  She sometimes gets morning headaches.  She does not use anything to help her fall sleep or stay awake.  She denies sleep walking, sleep talking, bruxism, or nightmares.  There is no history of restless legs.  She denies sleep hallucinations, sleep paralysis, or cataplexy.  The Epworth score is 9 out of 24.    Physical Exam:  General - pleasant Eyes - pupils reactive ENT - no sinus tenderness, no oral exudate, no LAN, overbite, MP 4, enlarged tongue Cardiac - regular, no murmur Chest - no wheeze, rales Abd - soft, non tender Ext - no edema Skin - no rashes Neuro - normal strength Psych - normal mood  Discussion: She has snoring, sleep disruption, witnessed apnea, and daytime sleepiness.  She has history of hypertension on several medications.  I am concerned she could have sleep apnea.  We discussed how sleep apnea can affect various health problems, including risks for hypertension, cardiovascular disease, and diabetes.  We also discussed how sleep disruption can increase risks  for accidents, such as while driving.  Weight loss as a means of improving sleep apnea was also reviewed.  Additional treatment options discussed were CPAP therapy, oral appliance, and surgical intervention.  Assessment/Plan:  Snoring with excessive daytime sleepiness. - will arrange for home sleep study   Patient Instructions  Will arrange for home sleep study Will call to arrange for follow up after sleep study reviewed     Coralyn HellingVineet Jobe Mutch, MD [facility] 05/29/2017, 10:01 AM  Flow Sheet  Sleep  Tests:  Review of Systems: Constitutional: Negative for fever and unexpected weight change.  HENT: Negative for congestion, dental problem, ear pain, nosebleeds, postnasal drip, rhinorrhea, sinus pressure, sneezing, sore throat and trouble swallowing.   Eyes: Negative for redness and itching.  Respiratory: Negative for cough, chest tightness, shortness of breath and wheezing.   Cardiovascular: Negative for palpitations and leg swelling.  Gastrointestinal: Negative for nausea and vomiting.  Genitourinary: Negative for dysuria.  Musculoskeletal: Negative for joint swelling.  Skin: Negative for rash.  Allergic/Immunologic: Negative.  Negative for environmental allergies, food allergies and immunocompromised state.  Neurological: Positive for headaches.  Hematological: Does not bruise/bleed easily.  Psychiatric/Behavioral: Negative for dysphoric mood. The patient is not nervous/anxious.    Past Medical History: She  has a past medical history of Hypertension.  Past Surgical History: She  has a past  surgical history that includes Tonsillectomy.  Family History: Her family history includes Breast cancer in her maternal aunt; Emphysema in her father; Lung cancer in her mother.  Social History: She  reports that she has never smoked. She has never used smokeless tobacco.  Medications: Allergies as of 05/29/2017   No Known Allergies     Medication List         Accurate as of 05/29/17 10:01 AM. Always use your most recent med list.          BIOTIN 5000 5 MG Caps Generic drug:  Biotin Take 1 capsule by mouth daily.   carvedilol 6.25 MG tablet Commonly known as:  COREG Take 6.25 mg by mouth 2 (two) times daily with a meal.   docusate sodium 100 MG capsule Commonly known as:  COLACE Take 100 mg by mouth 2 (two) times daily.   esomeprazole 40 MG capsule Commonly known as:  NEXIUM Take 40 mg by mouth daily before breakfast.   losartan-hydrochlorothiazide 100-25 MG tablet Commonly known as:  HYZAAR Take 1 tablet by mouth daily.   Olmesartan-amLODIPine-HCTZ 40-5-25 MG Tabs Take 40 mg by mouth daily.   omeprazole 40 MG capsule Commonly known as:  PRILOSEC Take 40 mg by mouth daily.

## 2017-06-24 DIAGNOSIS — G4733 Obstructive sleep apnea (adult) (pediatric): Secondary | ICD-10-CM | POA: Diagnosis not present

## 2017-06-25 ENCOUNTER — Encounter: Payer: Self-pay | Admitting: Pulmonary Disease

## 2017-06-25 ENCOUNTER — Telehealth: Payer: Self-pay | Admitting: Pulmonary Disease

## 2017-06-25 DIAGNOSIS — G4733 Obstructive sleep apnea (adult) (pediatric): Secondary | ICD-10-CM

## 2017-06-25 HISTORY — DX: Obstructive sleep apnea (adult) (pediatric): G47.33

## 2017-06-25 NOTE — Telephone Encounter (Signed)
HST 06/24/17 >> AHI 45.3, SaO2 low 79%   Will have my nurse inform pt that sleep study shows severe sleep apnea.  Options are 1) CPAP now, 2) ROV first.  If She is agreeable to CPAP, then please send order for auto CPAP range 5 to 15 cm H2O with heated humidity and mask of choice.  Have download sent 1 month after starting CPAP and set up ROV 2 months after starting CPAP.  ROV can be with me or NP.

## 2017-06-26 ENCOUNTER — Other Ambulatory Visit: Payer: Self-pay | Admitting: *Deleted

## 2017-06-26 DIAGNOSIS — G4719 Other hypersomnia: Secondary | ICD-10-CM

## 2017-06-26 DIAGNOSIS — R0683 Snoring: Secondary | ICD-10-CM

## 2017-06-26 DIAGNOSIS — G4733 Obstructive sleep apnea (adult) (pediatric): Secondary | ICD-10-CM | POA: Diagnosis not present

## 2017-07-03 ENCOUNTER — Other Ambulatory Visit: Payer: Self-pay | Admitting: Pulmonary Disease

## 2017-07-03 ENCOUNTER — Ambulatory Visit
Admission: RE | Admit: 2017-07-03 | Discharge: 2017-07-03 | Disposition: A | Discharge status: Home / Self Care | Payer: 59 | Source: Ambulatory Visit | Attending: Family Medicine | Admitting: Family Medicine

## 2017-07-03 DIAGNOSIS — Z1231 Encounter for screening mammogram for malignant neoplasm of breast: Secondary | ICD-10-CM

## 2017-07-03 DIAGNOSIS — G4733 Obstructive sleep apnea (adult) (pediatric): Secondary | ICD-10-CM

## 2017-07-03 NOTE — Telephone Encounter (Signed)
Called and spoke with patient regarding results.  Informed the patient of results and recommendations today. PT is agreeable to CPAP; Placed order for cpap machine auto CPAP range 5 to 15 cm H2O with heated humidity and mask of choice.  Have download sent 1 month after starting CPAP and set up ROV 2 months after starting CPAP.  Pt verbalized understanding and denied any questions or concerns at this time.  Nothing further needed.

## 2017-07-20 ENCOUNTER — Telehealth: Payer: Self-pay | Admitting: Pulmonary Disease

## 2017-07-20 NOTE — Telephone Encounter (Signed)
lmam for pt to call back I [facility] [place] [facility]  About the cost of this I will await the pt to call back

## 2017-07-21 NOTE — Telephone Encounter (Signed)
Pt calling back gave her the info I have and then gave her the phone number

## 2017-07-24 ENCOUNTER — Telehealth: Payer: Self-pay | Admitting: Pulmonary Disease

## 2017-07-24 NOTE — Telephone Encounter (Signed)
Called and spoke with patient regarding new cpap machine set up appt rec'd fax from [facility] needing appt between 7/21-19 and 10-21-17 Scheduled appt for 09-02-17 at 11:45am Nothing further needed at this time.

## 2017-09-02 ENCOUNTER — Ambulatory Visit: Payer: 59 | Admitting: Pulmonary Disease

## 2017-09-02 ENCOUNTER — Encounter: Payer: Self-pay | Admitting: Pulmonary Disease

## 2017-09-02 VITALS — BP 112/72 | HR 74 | Ht 64.0 in | Wt 180.2 lb

## 2017-09-02 DIAGNOSIS — G4733 Obstructive sleep apnea (adult) (pediatric): Secondary | ICD-10-CM | POA: Diagnosis not present

## 2017-09-02 NOTE — Patient Instructions (Signed)
Follow up in 1 year.

## 2017-09-02 NOTE — Progress Notes (Signed)
[name], Critical Care, and Sleep Medicine  Chief Complaint  Patient presents with  . Follow-up    OSA- CPAP    Constitutional: BP 112/72 (BP Location: Left Arm, Cuff Size: Normal)   Pulse 74   Ht 5\' 4"  (1.626 m)   Wt 180 lb 3.2 oz (81.7 kg)   SpO2 99%   BMI 30.93 kg/m   History of Present Illness: [name] is a 62 y.o. female with obstructive sleep apnea.  She had home sleep study in May.  Severe sleep apnea.  Started on CPAP.  She has been doing well with CPAP.  She has nasal cushion mask.  No issues with mask fit or pressure.  Denies sinus congestion, sore throat, or dry mouth.  Only gets 5 to 6 hrs sleep.  Her brother is in [facility] after episode of cirrhosis and CHF.     Comprehensive Respiratory Exam:  Appearance - well kempt  ENMT - nasal mucosa moist, turbinates clear, midline nasal septum, no dental lesions, no gingival bleeding, no oral exudates, no tonsillar hypertrophy, MP 4, scalloped tongue Neck - no masses, trachea midline, no thyromegaly, no elevation in JVP Respiratory - normal appearance of chest wall, normal respiratory effort w/o accessory muscle use, no dullness on percussion, no wheezing or rales CV - s1s2 regular rate and rhythm, no murmurs, no peripheral edema, no varicosities, radial pulses symmetric GI - soft, non tender Lymph - no adenopathy noted in neck and axillary areas MSK - normal muscle strength and tone, normal gait Ext - no cyanosis, clubbing, or joint inflammation noted Skin - no rashes, lesions, or ulcers Neuro - oriented to person, place, and time Psych - normal mood and affect   Assessment/Plan:  Obstructive sleep apnea. - she is compliant with CPAP and reports benefit from therapy - continue auto CPAP   Patient Instructions  Follow up in 1 year    Claire HellingVineet Cherelle Midkiff, MD [facility]/Critical Care 09/02/2017, 12:19 PM  Flow Sheet  Sleep  Tests: HST 06/24/17 >> AHI 45.3, SaO2 low 79% Auto CPAP 08/02/17  to 08/31/17 >> used on 30 of 30 nights with average 5 hrs 29 minutes.  Average AHI 2.1 with median CPAP 7 and 95 th percentile CPAP 11 cm H2O  Past Medical History: She  has a past medical history of Hypertension and OSA (obstructive sleep apnea) (06/25/2017).  Past Surgical History: She  has a past surgical history that includes Tonsillectomy.  Family History: Her family history includes Breast cancer in her maternal aunt; Emphysema in her father; Lung cancer in her mother.  Social History: She  reports that she has never smoked. She has never used smokeless tobacco.  Medications: Allergies as of 09/02/2017   No Known Allergies     Medication List        Accurate as of 09/02/17 12:19 PM. Always use your most recent med list.          BIOTIN 5000 5 MG Caps Generic drug:  Biotin Take 2 capsules by mouth daily.   Calcium 600-200 MG-UNIT tablet Take 1 tablet by mouth daily.   Olmesartan-amLODIPine-HCTZ 40-5-25 MG Tabs Take 40 mg by mouth daily.   omeprazole 40 MG capsule Commonly known as:  PRILOSEC Take 40 mg by mouth daily.

## 2018-05-02 ENCOUNTER — Other Ambulatory Visit: Payer: Self-pay

## 2018-05-02 ENCOUNTER — Emergency Department (HOSPITAL_COMMUNITY)
Admission: EM | Admit: 2018-05-02 | Discharge: 2018-05-02 | Disposition: A | Discharge status: Home / Self Care | Payer: 59 | Attending: Emergency Medicine | Admitting: Emergency Medicine

## 2018-05-02 ENCOUNTER — Encounter (HOSPITAL_COMMUNITY): Payer: Self-pay | Admitting: *Deleted

## 2018-05-02 DIAGNOSIS — I10 Essential (primary) hypertension: Secondary | ICD-10-CM | POA: Diagnosis not present

## 2018-05-02 DIAGNOSIS — Z79899 Other long term (current) drug therapy: Secondary | ICD-10-CM | POA: Insufficient documentation

## 2018-05-02 HISTORY — DX: Gastro-esophageal reflux disease without esophagitis: K21.9

## 2018-05-02 NOTE — ED Provider Notes (Signed)
[facility]-EMERGENCY DEPT Provider Note   CSN: [id number] Arrival date & time: 05/02/18  1855    History   Chief Complaint Chief Complaint  Patient presents with  . Hypertension    HPI [name] is a 63 y.o. female.     This is a 63 year old female presents with intermittent hypertension times several days.  Patient notes increased anxiety and has had no recent changes to her antihypertensive.  No associated chest pain or shortness of breath.  Did have a viral illness recently and was treated with a Z-Pak.  Denies any lower extremity pain or swelling.  No recent fever or chills.  Has had a mild headache without visual changes.  Denies any neurological changes.  No treatment for this use prior to arrival     Past Medical History:  Diagnosis Date  . GERD (gastroesophageal reflux disease)   . Hypertension   . OSA (obstructive sleep apnea) 06/25/2017    Patient Active Problem List   Diagnosis Date Noted  . OSA (obstructive sleep apnea) 06/25/2017    Past Surgical History:  Procedure Laterality Date  . TONSILLECTOMY       OB History   No obstetric history on file.      Home Medications    Prior to Admission medications   Medication Sig Start Date End Date Taking? Authorizing Provider  Biotin (BIOTIN 5000) 5 MG CAPS Take 2 capsules by mouth daily.     [provider]  Calcium 600-200 MG-UNIT tablet Take 1 tablet by mouth daily.    [provider]  Olmesartan-amLODIPine-HCTZ 40-5-25 MG TABS Take 40 mg by mouth daily.    [provider]  omeprazole (PRILOSEC) 40 MG capsule Take 40 mg by mouth daily.    [provider]    Family History Family History  Problem Relation Age of Onset  . Breast cancer Maternal Aunt   . Lung cancer Mother   . Emphysema Father     Social History Social History   Tobacco Use  . Smoking status: Never Smoker  . Smokeless tobacco: Never Used  Substance Use Topics  .  Alcohol use: Yes  . Drug use: Never     Allergies   Patient has no known allergies.   Review of Systems Review of Systems  All other systems reviewed and are negative.    Physical Exam Updated Vital Signs BP (!) 143/88   Pulse 82   Temp 99 F (37.2 C) (Oral)   Resp 15   Ht 1.626 m (5\' 4" )   Wt 81.6 kg   SpO2 96%   BMI 30.90 kg/m   Physical Exam Vitals signs and nursing note reviewed.  Constitutional:      General: She is not in acute distress.    Appearance: Normal appearance. She is well-developed. She is not toxic-appearing.  HENT:     Head: Normocephalic and atraumatic.  Eyes:     General: Lids are normal.     Conjunctiva/sclera: Conjunctivae normal.     Pupils: Pupils are equal, round, and reactive to light.  Neck:     Musculoskeletal: Normal range of motion and neck supple.     Thyroid: No thyroid mass.     Trachea: No tracheal deviation.  Cardiovascular:     Rate and Rhythm: Normal rate and regular rhythm.     Heart sounds: Normal heart sounds. No murmur. No gallop.   Pulmonary:     Effort: Pulmonary effort is normal. No  respiratory distress.     Breath sounds: Normal breath sounds. No stridor. No decreased breath sounds, wheezing, rhonchi or rales.  Abdominal:     General: Bowel sounds are normal. There is no distension.     Palpations: Abdomen is soft.     Tenderness: There is no abdominal tenderness. There is no rebound.  Musculoskeletal: Normal range of motion.        General: No tenderness.  Skin:    General: Skin is warm and dry.     Findings: No abrasion or rash.  Neurological:     Mental Status: She is alert and oriented to person, place, and time.     GCS: GCS eye subscore is 4. GCS verbal subscore is 5. GCS motor subscore is 6.     Cranial Nerves: No cranial nerve deficit.     Sensory: No sensory deficit.  Psychiatric:        Speech: Speech normal.        Behavior: Behavior normal.      ED Treatments / Results  Labs (all labs  ordered are listed, but only abnormal results are displayed) Labs Reviewed - No data to display  EKG None  Radiology No results found.  Procedures Procedures (including critical care time)  Medications Ordered in ED Medications - No data to display   Initial Impression / Assessment and Plan / ED Course  I have reviewed the triage vital signs and the nursing notes.  Pertinent labs & imaging results that were available during my care of the patient were reviewed by me and considered in my medical decision making (see chart for details).        Patient's blood pressure noted here and is not in the realm that requires any acute intervention.  She has no focal neurological findings on exam.  Instructed patient follow with her doctor next week and to keep a log of her blood pressures at home Final Clinical Impressions(s) / ED Diagnoses   Final diagnoses:  None    ED Discharge Orders    None       Lorre Nick, MD 05/02/18 1956

## 2018-05-02 NOTE — ED Triage Notes (Signed)
Pt had a cold and was taking OTC meds, not working, PMD started her on Zpack, today noted bp twice to be elevated, states she has a fever, 99.0

## 2018-05-02 NOTE — Discharge Instructions (Addendum)
Call your doctor to schedule an appointment for next week for repeat blood pressures

## 2018-05-05 ENCOUNTER — Other Ambulatory Visit: Payer: Self-pay | Admitting: Family Medicine

## 2018-05-05 DIAGNOSIS — Z1231 Encounter for screening mammogram for malignant neoplasm of breast: Secondary | ICD-10-CM

## 2018-07-05 ENCOUNTER — Ambulatory Visit: Payer: 59

## 2018-08-12 ENCOUNTER — Ambulatory Visit
Admission: RE | Admit: 2018-08-12 | Discharge: 2018-08-12 | Disposition: A | Discharge status: Home / Self Care | Payer: 59 | Source: Ambulatory Visit | Attending: Family Medicine | Admitting: Family Medicine

## 2018-08-12 ENCOUNTER — Other Ambulatory Visit: Payer: Self-pay

## 2018-08-12 DIAGNOSIS — Z1231 Encounter for screening mammogram for malignant neoplasm of breast: Secondary | ICD-10-CM

## 2020-01-10 LAB — GLUCOSE, POCT (MANUAL RESULT ENTRY): POC Glucose: 112 mg/dl — AB (ref 70–99)

## 2020-03-01 ENCOUNTER — Ambulatory Visit (INDEPENDENT_AMBULATORY_CARE_PROVIDER_SITE_OTHER): Payer: Medicare Other

## 2020-03-01 ENCOUNTER — Other Ambulatory Visit: Payer: Self-pay

## 2020-03-01 ENCOUNTER — Encounter: Payer: Self-pay | Admitting: Podiatry

## 2020-03-01 ENCOUNTER — Ambulatory Visit (INDEPENDENT_AMBULATORY_CARE_PROVIDER_SITE_OTHER): Payer: Medicare Other | Admitting: Podiatry

## 2020-03-01 DIAGNOSIS — M79672 Pain in left foot: Secondary | ICD-10-CM

## 2020-03-01 DIAGNOSIS — M21619 Bunion of unspecified foot: Secondary | ICD-10-CM

## 2020-03-01 DIAGNOSIS — M7672 Peroneal tendinitis, left leg: Secondary | ICD-10-CM | POA: Diagnosis not present

## 2020-03-01 DIAGNOSIS — M79671 Pain in right foot: Secondary | ICD-10-CM

## 2020-03-01 MED ORDER — TRIAMCINOLONE ACETONIDE 10 MG/ML IJ SUSP
10.0000 mg | Freq: Once | INTRAMUSCULAR | Status: AC
  Administered 2020-03-01: 10 mg

## 2020-03-01 NOTE — Progress Notes (Signed)
Subjective:   Patient ID: [name], female   DOB: 65 y.o.   MRN: [id number]   HPI Patient presents with a lot of pain in the outside of the left foot stating that it is hard for her to walk comfortably and its been going on around 4 months to 6 months.  Does not remember specific injury but does have a gradual increase in the symptoms over this period of time.  Patient does not smoke and would like to be more active   Review of Systems  All other systems reviewed and are negative.       Objective:  Physical Exam Vitals and nursing note reviewed.  Constitutional:      Appearance: She is well-developed and well-nourished.  Cardiovascular:     Pulses: Intact distal pulses.  Pulmonary:     Effort: Pulmonary effort is normal.  Musculoskeletal:        General: Normal range of motion.  Skin:    General: Skin is warm.  Neurological:     Mental Status: She is alert.     Neurovascular status intact muscle strength was found to be adequate range of motion adequate.  Patient is found to have discomfort with inflammation fluid around the fifth metatarsal base left with no range of motion loss or muscle strength loss.  Patient is found to have good digital perfusion well oriented x3     Assessment:  Acute peroneal tendinitis left inflammation fluid base of fifth metatarsal with pain     Plan:  H&P reviewed conditions and today did careful sterile prep and injected the fifth metatarsal base 3 mg Dexasone Kenalog 5 mg Xylocaine applied fascial brace to lift up the lateral side of the foot and immobilized and advised on ice therapy topical antiinflammatory.  Reappoint as symptoms indicate over the next few weeks  X-rays indicate no signs of fracture patient is noted to have moderate bunion deformity bilateral

## 2020-03-12 ENCOUNTER — Telehealth: Payer: Self-pay | Admitting: *Deleted

## 2020-03-12 NOTE — Telephone Encounter (Signed)
Patient called and stated that when she was in to see Dr Charlsie Merles she was given a plantar fascia brace and thinks that it was too small and the brace was a small/medium and would like a large or x-large and I tried to call the patient back and the voice mail is full and can not leave a message. Misty Stanley

## 2020-05-28 ENCOUNTER — Other Ambulatory Visit: Payer: Self-pay

## 2020-05-28 ENCOUNTER — Ambulatory Visit
Admission: EM | Admit: 2020-05-28 | Discharge: 2020-05-28 | Disposition: A | Discharge status: Home / Self Care | Payer: Medicare Other | Attending: Emergency Medicine | Admitting: Emergency Medicine

## 2020-05-28 DIAGNOSIS — Z1152 Encounter for screening for COVID-19: Secondary | ICD-10-CM

## 2020-05-28 DIAGNOSIS — Z20822 Contact with and (suspected) exposure to covid-19: Secondary | ICD-10-CM | POA: Diagnosis not present

## 2020-05-28 DIAGNOSIS — J0100 Acute maxillary sinusitis, unspecified: Secondary | ICD-10-CM

## 2020-05-28 MED ORDER — FLUTICASONE PROPIONATE 50 MCG/ACT NA SUSP: 1.0000 | Freq: Every day | NASAL | 0 refills | Status: AC

## 2020-05-28 MED ORDER — BENZONATATE 200 MG PO CAPS: 200.0000 mg | ORAL_CAPSULE | Freq: Three times a day (TID) | ORAL | 0 refills | Status: AC | PRN

## 2020-05-28 MED ORDER — AMOXICILLIN-POT CLAVULANATE 875-125 MG PO TABS: 1.0000 | ORAL_TABLET | Freq: Two times a day (BID) | ORAL | 0 refills | Status: AC

## 2020-05-28 NOTE — Discharge Instructions (Signed)
COVID test pending, monitor MyChart for results Augmentin twice daily for 1 week May continue Mucinex DM Tessalon every 8 hours for cough Flonase nasal spray 1 to 2 spray in each nostril daily Rest and fluids Tylenol and ibuprofen as needed Follow-up if not improving or worsening

## 2020-05-28 NOTE — ED Triage Notes (Signed)
Pt c/o cough, runny nose, sneezing, and post nasal drip since last Wednesday after being outside. States taking dayquil today. Requesting covid testing.

## 2020-05-29 NOTE — ED Provider Notes (Signed)
[facility]    CSN: [id number] Arrival date & time: 05/28/20  1823      History   Chief Complaint Chief Complaint  Patient presents with  . Cough    HPI [name] is a 65 y.o. female history of GERD, OSA, hypertension presenting today for evaluation of URI symptoms.  Reports associated cough, congestion, postnasal drainage.  Symptoms began last Wednesday, approximately 6 days ago.  Symptoms began after being outside.  Taking DayQuil.  Has developed fevers over the past 1 to 2 days and reports a lot of sinus pressure.  HPI  Past Medical History:  Diagnosis Date  . GERD (gastroesophageal reflux disease)   . Hypertension   . OSA (obstructive sleep apnea) 06/25/2017    Patient Active Problem List   Diagnosis Date Noted  . OSA (obstructive sleep apnea) 06/25/2017    Past Surgical History:  Procedure Laterality Date  . TONSILLECTOMY      OB History   No obstetric history on file.      Home Medications    Prior to Admission medications   Medication Sig Start Date End Date Taking? Authorizing Provider  amoxicillin-clavulanate (AUGMENTIN) 875-125 MG tablet Take 1 tablet by mouth every 12 (twelve) hours for 7 days. 05/28/20 06/04/20 Yes Vista Sawatzky C, PA-C  benzonatate (TESSALON) 200 MG capsule Take 1 capsule (200 mg total) by mouth 3 (three) times daily as needed for up to 7 days for cough. 05/28/20 06/04/20 Yes Aaryanna Hyden C, PA-C  fluticasone (FLONASE) 50 MCG/ACT nasal spray Place 1-2 sprays into both nostrils daily. 05/28/20  Yes Amiee Wiley C, PA-C  Biotin (BIOTIN 5000) 5 MG CAPS Take 2 capsules by mouth daily.     [provider]  Calcium 600-200 MG-UNIT tablet Take 1 tablet by mouth daily.    [provider]  ELDERBERRY PO Take by mouth.    [provider]  Multiple Vitamin (MULTIVITAMIN ADULT PO) Take by mouth.    [provider]  Olmesartan-amLODIPine-HCTZ 40-5-25 MG TABS Take 40 mg by mouth daily.     [provider]  Omega-3 1000 MG CAPS Take by mouth.    [provider]  omeprazole (PRILOSEC) 40 MG capsule Take 40 mg by mouth daily.    [provider]    Family History Family History  Problem Relation Age of Onset  . Breast cancer Maternal Aunt   . Lung cancer Mother   . Emphysema Father     Social History Social History   Tobacco Use  . Smoking status: Never Smoker  . Smokeless tobacco: Never Used  Substance Use Topics  . Alcohol use: Yes  . Drug use: Never     Allergies   Patient has no known allergies.   Review of Systems Review of Systems  Constitutional: Positive for fever. Negative for activity change, appetite change, chills and fatigue.  HENT: Positive for congestion, rhinorrhea, sinus pressure and sore throat. Negative for ear pain and trouble swallowing.   Eyes: Negative for discharge and redness.  Respiratory: Positive for cough. Negative for chest tightness and shortness of breath.   Cardiovascular: Negative for chest pain.  Gastrointestinal: Negative for abdominal pain, diarrhea, nausea and vomiting.  Musculoskeletal: Negative for myalgias.  Skin: Negative for rash.  Neurological: Negative for dizziness, light-headedness and headaches.     Physical Exam Triage Vital Signs ED Triage Vitals [05/28/20 1928]  Enc Vitals Group     BP 128/89     Pulse Rate 92  Resp 18     Temp 100.1 F (37.8 C)     Temp Source Oral     SpO2 94 %     Weight      Height      Head Circumference      Peak Flow      Pain Score 0     Pain Loc      Pain Edu?      Excl. in GC?    No data found.  Updated Vital Signs BP 128/89 (BP Location: Left Arm)   Pulse 92   Temp 100.1 F (37.8 C) (Oral)   Resp 18   SpO2 94%   Visual Acuity Right Eye Distance:   Left Eye Distance:   Bilateral Distance:    Right Eye Near:   Left Eye Near:    Bilateral Near:     Physical Exam Vitals and nursing note reviewed.  Constitutional:       Appearance: She is well-developed.     Comments: No acute distress  HENT:     Head: Normocephalic and atraumatic.     Ears:     Comments: Bilateral ears without tenderness to palpation of external auricle, tragus and mastoid, EAC's without erythema or swelling, TM's with good bony landmarks and cone of light. Non erythematous.     Nose: Nose normal.     Mouth/Throat:     Comments: Oral mucosa pink and moist, no tonsillar enlargement or exudate. Posterior pharynx patent and nonerythematous, no uvula deviation or swelling. Normal phonation. Eyes:     Conjunctiva/sclera: Conjunctivae normal.  Cardiovascular:     Rate and Rhythm: Normal rate and regular rhythm.  Pulmonary:     Effort: Pulmonary effort is normal. No respiratory distress.     Comments: Breathing comfortably at rest, CTABL, no wheezing, rales or other adventitious sounds auscultated Abdominal:     General: There is no distension.  Musculoskeletal:        General: Normal range of motion.     Cervical back: Neck supple.  Skin:    General: Skin is warm and dry.  Neurological:     Mental Status: She is alert and oriented to person, place, and time.      UC Treatments / Results  Labs (all labs ordered are listed, but only abnormal results are displayed) Labs Reviewed  NOVEL CORONAVIRUS, NAA    EKG   Radiology No results found.  Procedures Procedures (including critical care time)  Medications Ordered in UC Medications - No data to display  Initial Impression / Assessment and Plan / UC Course  I have reviewed the triage vital signs and the nursing notes.  Pertinent labs & imaging results that were available during my care of the patient were reviewed by me and considered in my medical decision making (see chart for details).     5 days of URI symptoms with recent worsening with associated fever, concerning for sinusitis and treating with Augmentin.  Mucinex and Tessalon for cough, push fluids.  COVID test  pending for screening.  Discussed strict return precautions. Patient verbalized understanding and is agreeable with plan.  Final Clinical Impressions(s) / UC Diagnoses   Final diagnoses:  Encounter for screening laboratory testing for COVID-19 virus  Acute non-recurrent maxillary sinusitis     Discharge Instructions     COVID test pending, monitor MyChart for results Augmentin twice daily for 1 week May continue Mucinex DM Tessalon every 8 hours for cough Flonase nasal spray 1  to 2 spray in each nostril daily Rest and fluids Tylenol and ibuprofen as needed Follow-up if not improving or worsening    ED Prescriptions    Medication Sig Dispense Auth. Provider   amoxicillin-clavulanate (AUGMENTIN) 875-125 MG tablet Take 1 tablet by mouth every 12 (twelve) hours for 7 days. 14 tablet Brooklynne Pereida C, PA-C   fluticasone (FLONASE) 50 MCG/ACT nasal spray Place 1-2 sprays into both nostrils daily. 16 g Emani Taussig C, PA-C   benzonatate (TESSALON) 200 MG capsule Take 1 capsule (200 mg total) by mouth 3 (three) times daily as needed for up to 7 days for cough. 28 capsule Shakinah Navis, [place] C, PA-C     PDMP not reviewed this encounter.   Lew Dawes, [place] 05/29/20 [phone]

## 2020-05-30 LAB — NOVEL CORONAVIRUS, NAA: SARS-CoV-2, NAA: NOT DETECTED

## 2020-05-30 LAB — SARS-COV-2, NAA 2 DAY TAT

## 2020-06-15 ENCOUNTER — Other Ambulatory Visit: Payer: Self-pay

## 2020-06-15 ENCOUNTER — Encounter: Payer: Self-pay | Admitting: Pulmonary Disease

## 2020-06-15 ENCOUNTER — Ambulatory Visit (INDEPENDENT_AMBULATORY_CARE_PROVIDER_SITE_OTHER): Payer: Medicare Other | Admitting: Pulmonary Disease

## 2020-06-15 VITALS — BP 124/80 | HR 78 | Temp 97.0°F | Ht 64.0 in | Wt 173.6 lb

## 2020-06-15 DIAGNOSIS — G4733 Obstructive sleep apnea (adult) (pediatric): Secondary | ICD-10-CM | POA: Diagnosis not present

## 2020-06-15 NOTE — Patient Instructions (Signed)
Will arrange for home sleep study Will call to arrange for follow up after sleep study reviewed  

## 2020-06-15 NOTE — Progress Notes (Signed)
[facility], Critical Care, and Sleep Medicine  Chief Complaint  Patient presents with  . Follow-up    F/U for OSA. States she has not used her machine in 2 years due to not liking her mask.     Constitutional:  BP 124/80   Pulse 78   Temp (!) 97 F (36.1 C) (Temporal)   Ht 5\' 4"  (1.626 m)   Wt 173 lb 9.6 oz (78.7 kg)   SpO2 98% Comment: on RA  BMI 29.80 kg/m   Past Medical History:  HTN, GERD, Positive ANA, Pre-DM, HLD, Palpitations, Vit D deficiency  Past Surgical History:  She  has a past surgical history that includes Tonsillectomy.  Brief Summary:  [name] is a 65 y.o. female with obstructive sleep apnea.      Subjective:   I last saw her in 2019.  She had sleep study then that showed severe sleep apnea.  She was started on auto CPAP.  Her brother passed away a couple of years ago and that threw her off her routine.  She stopped using CPAP.  She was seen recently by cardiology for palpitations.  She was told that sleep apnea can impact her heart function and she should revisit sleep apnea therapy.  She is a restless sleeper.  She wakes up at least once to use the bathroom.  She can have trouble falling back to sleep, especially when she has something on her mind.  Not using anything to help sleep.  Snores some.  Feels more tired during the day.  Physical Exam:   Appearance - well kempt   ENMT - no sinus tenderness, no oral exudate, no LAN, Mallampati 4 airway, no stridor  Respiratory - equal breath sounds bilaterally, no wheezing or rales  CV - s1s2 regular rate and rhythm, no murmurs  Ext - no clubbing, no edema  Skin - no rashes  Psych - normal mood and affect   Sleep Tests:   HST 06/24/17 >> AHI 45.3, SaO2 low 79%  Auto CPAP 08/02/17 to 08/31/17 >> used on 30 of 30 nights with average 5 hrs 29 minutes.  Average AHI 2.1 with median CPAP 7 and 95 th percentile CPAP 11 cm H2O  Social History:  She  reports that she has never smoked.  She has never used smokeless tobacco. She reports current alcohol use. She reports that she does not use drugs.  Family History:  Her family history includes Breast cancer in her maternal aunt; Emphysema in her father; Lung cancer in her mother.    Discussion:  She has snoring, sleep disruption, apnea, and daytime sleepiness.  She has history of hypertension.  Previous sleep study showed severe sleep apnea.  I am concerned she still has clinically significant sleep apnea.  Assessment/Plan:   Obstructive sleep apnea. - will arrange for home sleep study to assess current status of sleep apnea - discussed how untreated sleep apnea can impact her health - treatment options for sleep apnea reviewed  Time Spent Involved in Patient Care on Day of Examination:  22 minutes  Follow up:  Patient Instructions  Will arrange for home sleep study Will call to arrange for follow up after sleep study reviewed    Medication List:   Allergies as of 06/15/2020   No Known Allergies     Medication List       Accurate as of Jun 15, 2020 11:14 AM. If you have any questions, ask your nurse or doctor.  Biotin 5000 5 MG Caps Generic drug: Biotin Take 2 capsules by mouth daily.   Calcium 600-200 MG-UNIT tablet Take 1 tablet by mouth daily.   ELDERBERRY PO Take by mouth.   fluticasone 50 MCG/ACT nasal spray Commonly known as: FLONASE Place 1-2 sprays into both nostrils daily.   MULTIVITAMIN ADULT PO Take by mouth.   Olmesartan-amLODIPine-HCTZ 40-5-25 MG Tabs Take 40 mg by mouth daily.   Omega-3 1000 MG Caps Take by mouth.   omeprazole 40 MG capsule Commonly known as: PRILOSEC Take 40 mg by mouth daily.       Signature:  Coralyn Helling, MD [facility] Pulmonary/Critical Care Pager - [phone] 06/15/2020, 11:14 AM

## 2020-07-19 ENCOUNTER — Ambulatory Visit: Payer: Medicare Other

## 2020-07-19 ENCOUNTER — Other Ambulatory Visit: Payer: Self-pay

## 2020-07-19 DIAGNOSIS — G4733 Obstructive sleep apnea (adult) (pediatric): Secondary | ICD-10-CM | POA: Diagnosis not present

## 2020-08-07 ENCOUNTER — Telehealth: Payer: Self-pay | Admitting: Pulmonary Disease

## 2020-08-07 DIAGNOSIS — G4733 Obstructive sleep apnea (adult) (pediatric): Secondary | ICD-10-CM | POA: Diagnosis not present

## 2020-08-07 NOTE — Telephone Encounter (Signed)
07/19/20 >> AHI 29.1, SpO2 low 77%   Please inform her that her sleep study shows moderate to severe obstructive sleep apnea.  Please arrange for ROV with me or NP to discuss treatment options.

## 2020-08-07 NOTE — Telephone Encounter (Signed)
ATC patient to go over sleep study results per DPR left detailed message with results and asked patient to call back to get scheduled with either Dr. Craige Cotta or NP.  When patient calls back please schedule her with Dr. Craige Cotta or NP to go over sleep study results.

## 2020-08-09 NOTE — Telephone Encounter (Signed)
ATC patient to get her scheduled for appointment [facility]

## 2020-08-10 NOTE — Telephone Encounter (Signed)
Spoke with patient to go over sleep study results and to let her know that Dr. Craige Cotta would like her to have appt to discuss results and treatment options. She is now scheduled with Tammy Parrett. Nothing further needed at this time.  Next Appt With Pulmonology (Tammy Parrett, NP)09/04/2020 at 10:30 AM

## 2020-09-04 ENCOUNTER — Ambulatory Visit: Payer: Medicare Other | Admitting: Adult Health

## 2020-09-10 ENCOUNTER — Other Ambulatory Visit: Payer: Self-pay

## 2020-09-10 ENCOUNTER — Ambulatory Visit (INDEPENDENT_AMBULATORY_CARE_PROVIDER_SITE_OTHER): Payer: Medicare Other | Admitting: Adult Health

## 2020-09-10 ENCOUNTER — Encounter: Payer: Self-pay | Admitting: Adult Health

## 2020-09-10 DIAGNOSIS — G4733 Obstructive sleep apnea (adult) (pediatric): Secondary | ICD-10-CM | POA: Diagnosis not present

## 2020-09-10 NOTE — Patient Instructions (Addendum)
Please Restart CPAP at bedtime Order for new supplies  Try dream wear nasal mask .  Goal was to wear CPAP all night long. Work on healthy weight loss Do not drive if sleepy Follow-up in 6- 8 weeks with Dr. Craige Cotta or Korayma Hagwood NP and As needed

## 2020-09-10 NOTE — Addendum Note (Signed)
Addended by: Dorisann Frames R on: 09/10/2020 04:59 PM   Modules accepted: Orders

## 2020-09-10 NOTE — Assessment & Plan Note (Addendum)
Moderate to severe obstructive sleep apnea.  Patient education given.  Patient would like to proceed with CPAP therapy.  Patient education on CPAP. Patient does have a CPAP machine at home.  She will use her current machine.  Order for new supplies and new mask have been sent to her local DME.  Patient is on auto CPAP 5 to 15 cm H2O.  We will do CPAP download on return visit  Plan  Patient Instructions  Please Restart CPAP at bedtime Order for new supplies  Try dream wear nasal mask .  Goal was to wear CPAP all night long. Work on healthy weight loss Do not drive if sleepy Follow-up in 6- 8 weeks with Dr. Craige Cotta or Raybon Conard NP and As needed

## 2020-09-10 NOTE — Assessment & Plan Note (Signed)
Healthy weight loss discussed 

## 2020-09-10 NOTE — Progress Notes (Signed)
@Patient  ID: , female    DOB: 1955-08-25, 65 y.o.   MRN: [id number]  Chief Complaint  Patient presents with   Follow-up    Referring provider: [id number], MD  HPI: 65 year old female followed for obstructive sleep apnea  TEST/EVENTS :  HST 06/24/17 >> AHI 45.3, SaO2 low 79% Auto CPAP 08/02/17 to 08/31/17 >> used on 30 of 30 nights with average 5 hrs 29 minutes.  Average AHI 2.1 with median CPAP 7 and 95 th percentile CPAP 11 cm H2O  09/10/2020 Follow up : OSA  Patient presents for a follow-up visit.  Patient has underlying obstructive sleep apnea.  Was previously diagnosed with severe sleep apnea in 2019.  Started on CPAP.  Unfortunately patient had some family issues and stopped wearing her machine.  She returned back to the office in May with ongoing daytime sleepiness, restless sleep.  She also been seen by cardiology and recommended to restart on her CPAP as she has some ongoing palpitations.  Patient had a home sleep study completed on July 19, 2020 that showed moderate to severe sleep apnea with AHI at 29.1/hour and SPO2 low of 77%.  We went over her sleep study results.  Went over treatment options including weight loss and CPAP. Still has machine from initial start. Needs all new supplies .  BMI is 30   No Known Allergies  Immunization History  Administered Date(s) Administered   Fluad Quad(high Dose 65+) 11/13/2017, 10/31/2019   Influenza, Seasonal, Injecte, Preservative Fre 11/22/2014, 12/01/2015   Influenza,inj,Quad PF,6+ Mos 10/08/2016, 11/13/2017, 10/18/2018   Influenza-Unspecified 10/08/2016   PFIZER(Purple Top)SARS-COV-2 Vaccination 05/10/2019, 05/31/2019, 01/08/2020   Td 07/02/1993, 05/05/2003   Tdap 05/05/2003, 10/02/2014    Past Medical History:  Diagnosis Date   GERD (gastroesophageal reflux disease)    Hypertension    OSA (obstructive sleep apnea) 06/25/2017    Tobacco History: Social History   Tobacco Use  Smoking Status Never   Smokeless Tobacco Never   Counseling given: Not Answered   Outpatient Medications Prior to Visit  Medication Sig Dispense Refill   Biotin (BIOTIN 5000) 5 MG CAPS Take 2 capsules by mouth daily.      Calcium 600-200 MG-UNIT tablet Take 1 tablet by mouth daily.     ELDERBERRY PO Take by mouth.     fluticasone (FLONASE) 50 MCG/ACT nasal spray Place 1-2 sprays into both nostrils daily. 16 g 0   Multiple Vitamin (MULTIVITAMIN ADULT PO) Take by mouth.     Olmesartan-amLODIPine-HCTZ 40-5-25 MG TABS Take 40 mg by mouth daily.     Omega-3 1000 MG CAPS Take by mouth.     omeprazole (PRILOSEC) 40 MG capsule Take 40 mg by mouth daily.     No facility-administered medications prior to visit.     Review of Systems:   Constitutional:   No  weight loss, night sweats,  Fevers, chills, fatigue, or  lassitude.  HEENT:   No headaches,  Difficulty swallowing,  Tooth/dental problems, or  Sore throat,                No sneezing, itching, ear ache, nasal congestion, post nasal drip,   CV:  No chest pain,  Orthopnea, PND, swelling in lower extremities, anasarca, dizziness, palpitations, syncope.   GI  No heartburn, indigestion, abdominal pain, nausea, vomiting, diarrhea, change in bowel habits, loss of appetite, bloody stools.   Resp: No shortness of breath with exertion or at rest.  No excess mucus, no productive cough,  No non-productive cough,  No coughing up of blood.  No change in color of mucus.  No wheezing.  No chest wall deformity  Skin: no rash or lesions.  GU: no dysuria, change in color of urine, no urgency or frequency.  No flank pain, no hematuria   MS:  No joint pain or swelling.  No decreased range of motion.  No back pain.    Physical Exam  BP 124/68 (BP Location: Left Arm, Patient Position: Sitting, Cuff Size: Normal)   Pulse 78   Temp 97.8 F (36.6 C) (Oral)   Ht 5\' 4"  (1.626 m)   Wt 177 lb 9.6 oz (80.6 kg)   SpO2 98%   BMI 30.48 kg/m   GEN: A/Ox3; pleasant , NAD,  well nourished    HEENT:  /AT,   , NOSE-clear, THROAT-clear, no lesions, no postnasal drip or exudate noted. Class 2 MP airway   NECK:  Supple w/ fair ROM; no JVD; normal carotid impulses w/o bruits; no thyromegaly or nodules palpated; no lymphadenopathy.    RESP  Clear  P & A; w/o, wheezes/ rales/ or rhonchi. no accessory muscle use, no dullness to percussion  CARD:  RRR, no m/r/g, no peripheral edema, pulses intact, no cyanosis or clubbing.  GI:   Soft & nt; nml bowel sounds; no organomegaly or masses detected.   Musco: Warm bil, no deformities or joint swelling noted.   Neuro: alert, no focal deficits noted.    Skin: Warm, no lesions or rashes    Lab Results:  CBC     BNP   Imaging: No results found.    No flowsheet data found.  No results found for: NITRICOXIDE      Assessment & Plan:   OSA (obstructive sleep apnea) Moderate to severe obstructive sleep apnea.  Patient education given.  Patient would like to proceed with CPAP therapy.  Patient education on CPAP. Patient does have a CPAP machine at home.  She will use her current machine.  Order for new supplies and new mask have been sent to her local DME.  Patient is on auto CPAP 5 to 15 cm H2O.  We will do CPAP download on return visit  Plan  Patient Instructions  Please Restart CPAP at bedtime Order for new supplies  Try dream wear nasal mask .  Goal was to wear CPAP all night long. Work on healthy weight loss Do not drive if sleepy Follow-up in 6- 8 weeks with Dr. or Hiawatha Dressel NP and As needed        Morbid obesity (HCC) Healthy weight loss discussed     Craige Cotta, NP 09/10/2020

## 2020-10-02 ENCOUNTER — Telehealth: Payer: Self-pay | Admitting: Adult Health

## 2020-10-02 NOTE — Telephone Encounter (Signed)
Message received from [place]:    Call made to patient, patient states this is the incorrect mask. She states this is the same mask she had before. She states while here she picked out a mask with TP.   TP please advise we sent order for Dream Wear Nasal mask. Patient is saying this is not what was discussed with you in office.

## 2020-10-02 NOTE — Telephone Encounter (Signed)
Call returned to patient, confirmed DOB. Patient states her and TP picked out a cpap mask and [facility] is telling her they did not get the order.   Community message sent to [employer]. Will await follow up. Will hold in triage until we hear back from [place].

## 2020-10-03 NOTE — Telephone Encounter (Signed)
Received this message from [place] New:  New, Leo Grosser; Amie Critchley, [place], LPN Hello,   Per our Advice worker; the order was in but could not be seen at time of order from patient and incorrect mask was sent. We will reach out to the pt, and process a return and a reship for the corrected mask and cushions for the new mask.   Thank you,   Nida Boatman New    Call made to patient to make aware [facility] will be sending new order for new mask. Voiced understanding.  Nothing further needed at this time.

## 2020-10-29 ENCOUNTER — Ambulatory Visit: Payer: Medicare Other | Admitting: Pulmonary Disease

## 2020-11-06 ENCOUNTER — Ambulatory Visit: Payer: Medicare Other | Admitting: Dermatology

## 2020-11-12 ENCOUNTER — Ambulatory Visit: Payer: Medicare Other | Admitting: Pulmonary Disease

## 2020-12-07 ENCOUNTER — Ambulatory Visit: Payer: Medicare Other | Admitting: Pulmonary Disease

## 2021-02-11 ENCOUNTER — Ambulatory Visit
Admission: EM | Admit: 2021-02-11 | Discharge: 2021-02-11 | Disposition: A | Discharge status: Home / Self Care | Payer: Medicare Other | Attending: Internal Medicine | Admitting: Internal Medicine

## 2021-02-11 ENCOUNTER — Other Ambulatory Visit: Payer: Self-pay

## 2021-02-11 ENCOUNTER — Encounter: Payer: Self-pay | Admitting: Emergency Medicine

## 2021-02-11 DIAGNOSIS — J029 Acute pharyngitis, unspecified: Secondary | ICD-10-CM | POA: Insufficient documentation

## 2021-02-11 DIAGNOSIS — J069 Acute upper respiratory infection, unspecified: Secondary | ICD-10-CM | POA: Diagnosis present

## 2021-02-11 LAB — POCT RAPID STREP A (OFFICE): Rapid Strep A Screen: NEGATIVE

## 2021-02-11 NOTE — ED Provider Notes (Signed)
[facility]    CSN: [id number] Arrival date & time: 02/11/21  1830      History   Chief Complaint Chief Complaint  Patient presents with   Headache    HPI [name] is a 66 y.o. female.   Patient presents with headache, fever, and sore throat that started yesterday.  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  T-max at home was 101.  Denies chest pain, shortness of breath, ear pain, nausea, vomiting, diarrhea, abdominal pain.  She has taken several over-the-counter cold and flu medications including Coricidin HBP with minimal improvement in symptoms.  Denies coughing but patient does appear to have cough during physical exam.   Headache  Past Medical History:  Diagnosis Date   GERD (gastroesophageal reflux disease)    Hypertension    OSA (obstructive sleep apnea) 06/25/2017    Patient Active Problem List   Diagnosis Date Noted   Morbid obesity (HCC) 09/10/2020   OSA (obstructive sleep apnea) 06/25/2017    Past Surgical History:  Procedure Laterality Date   TONSILLECTOMY      OB History   No obstetric history on file.      Home Medications    Prior to Admission medications   Medication Sig Start Date End Date Taking? Authorizing Provider  Biotin (BIOTIN 5000) 5 MG CAPS Take 2 capsules by mouth daily.     [provider]  Calcium 600-200 MG-UNIT tablet Take 1 tablet by mouth daily.    [provider]  ELDERBERRY PO Take by mouth.    [provider]  fluticasone (FLONASE) 50 MCG/ACT nasal spray Place 1-2 sprays into both nostrils daily. 05/28/20   Wieters, Hallie C, PA-C  Multiple Vitamin (MULTIVITAMIN ADULT PO) Take by mouth.    [provider]  Olmesartan-amLODIPine-HCTZ 40-5-25 MG TABS Take 40 mg by mouth daily.    [provider]  Omega-3 1000 MG CAPS Take by mouth.    [provider]   omeprazole (PRILOSEC) 40 MG capsule Take 40 mg by mouth daily.    [provider]    Family History Family History  Problem Relation Age of Onset   Breast cancer Maternal Aunt    Lung cancer Mother    Emphysema Father     Social History Social History   Tobacco Use   Smoking status: Never   Smokeless tobacco: Never  Substance Use Topics   Alcohol use: Yes   Drug use: Never     Allergies   Patient has no known allergies.   Review of Systems Review of Systems Per HPI  Physical Exam Triage Vital Signs ED Triage Vitals  Enc Vitals Group     BP 02/11/21 1927 112/74     Pulse Rate 02/11/21 1927 (!) 102     Resp 02/11/21 1927 20     Temp 02/11/21 1927 99.2 F (37.3 C)     Temp Source 02/11/21 1927 Oral     SpO2 02/11/21 1927 94 %     Weight --      Height --      Head Circumference --      Peak Flow --      Pain Score 02/11/21 1937 1     Pain Loc --      Pain Edu? --      Excl. in GC? --  No data found.  Updated Vital Signs BP 112/74 (BP Location: Left Arm)    Pulse (!) 102    Temp 99.2 F (37.3 C) (Oral)    Resp 20    SpO2 94%   Visual Acuity Right Eye Distance:   Left Eye Distance:   Bilateral Distance:    Right Eye Near:   Left Eye Near:    Bilateral Near:     Physical Exam Constitutional:      General: She is not in acute distress.    Appearance: Normal appearance. She is not toxic-appearing or diaphoretic.  HENT:     Head: Normocephalic and atraumatic.     Right Ear: Tympanic membrane and ear canal normal.     Left Ear: Tympanic membrane and ear canal normal.     Nose: Congestion present.     Mouth/Throat:     Mouth: Mucous membranes are moist.     Pharynx: Posterior oropharyngeal erythema present.  Eyes:     Extraocular Movements: Extraocular movements intact.     Conjunctiva/sclera: Conjunctivae normal.     Pupils: Pupils are equal, round, and reactive to light.  Cardiovascular:     Rate and Rhythm: Normal rate and  regular rhythm.     Pulses: Normal pulses.     Heart sounds: Normal heart sounds.  Pulmonary:     Effort: Pulmonary effort is normal. No respiratory distress.     Breath sounds: Normal breath sounds. No stridor. No wheezing, rhonchi or rales.  Abdominal:     General: Abdomen is flat. Bowel sounds are normal.     Palpations: Abdomen is soft.  Musculoskeletal:        General: Normal range of motion.     Cervical back: Normal range of motion.  Skin:    General: Skin is warm and dry.  Neurological:     General: No focal deficit present.     Mental Status: She is alert and oriented to person, place, and time. Mental status is at baseline.  Psychiatric:        Mood and Affect: Mood normal.        Behavior: Behavior normal.     UC Treatments / Results  Labs (all labs ordered are listed, but only abnormal results are displayed) Labs Reviewed  CULTURE, GROUP A STREP ([facility])  COVID-19, FLU A+B NAA  POCT RAPID STREP A (OFFICE)    EKG   Radiology No results found.  Procedures Procedures (including critical care time)  Medications Ordered in UC Medications - No data to display  Initial Impression / Assessment and Plan / UC Course  I have reviewed the triage vital signs and the nursing notes.  Pertinent labs & imaging results that were available during my care of the patient were reviewed by me and considered in my medical decision making (see chart for details).     Patient presents with symptoms likely from a viral upper respiratory infection. Differential includes bacterial pneumonia, sinusitis, allergic rhinitis, COVID-19, flu. Do not suspect underlying cardiopulmonary process. Symptoms seem unlikely related to ACS, CHF or COPD exacerbations, pneumonia, pneumothorax. Patient is nontoxic appearing and not in need of emergent medical intervention.  Rapid strep was negative.  Throat culture, COVID-19, flu test pending.  Recommended symptom control with over the counter  medications.  Discussed medications with patient.  Return if symptoms fail to improve in 1-2 weeks or you develop shortness of breath, chest pain, severe headache. Patient states understanding and is agreeable.  Discharged with  PCP followup.  Final Clinical Impressions(s) / UC Diagnoses   Final diagnoses:  Viral upper respiratory infection  Sore throat   Discharge Instructions   None    ED Prescriptions   None    PDMP not reviewed this encounter.   Gustavus Bryant, [place] 02/11/21 2002

## 2021-02-11 NOTE — ED Triage Notes (Signed)
Headache starting yesterday with a mildly itching sore throat. Works at [employer] and states she does not wear a mask. Requesting a covid test

## 2021-02-13 LAB — COVID-19, FLU A+B NAA
Influenza A, NAA: NOT DETECTED
Influenza B, NAA: NOT DETECTED
SARS-CoV-2, NAA: DETECTED — AB

## 2021-02-15 LAB — CULTURE, GROUP A STREP (THRC)

## 2021-08-23 ENCOUNTER — Emergency Department (HOSPITAL_COMMUNITY)
Admission: EM | Admit: 2021-08-23 | Discharge: 2021-08-23 | Disposition: A | Discharge status: Home / Self Care | Payer: Medicare Other | Attending: Emergency Medicine | Admitting: Emergency Medicine

## 2021-08-23 ENCOUNTER — Encounter (HOSPITAL_COMMUNITY): Payer: Self-pay

## 2021-08-23 ENCOUNTER — Emergency Department (HOSPITAL_COMMUNITY): Payer: Medicare Other

## 2021-08-23 ENCOUNTER — Other Ambulatory Visit: Payer: Self-pay

## 2021-08-23 DIAGNOSIS — R110 Nausea: Secondary | ICD-10-CM | POA: Diagnosis not present

## 2021-08-23 DIAGNOSIS — R42 Dizziness and giddiness: Secondary | ICD-10-CM | POA: Insufficient documentation

## 2021-08-23 DIAGNOSIS — E860 Dehydration: Secondary | ICD-10-CM | POA: Insufficient documentation

## 2021-08-23 DIAGNOSIS — R1010 Upper abdominal pain, unspecified: Secondary | ICD-10-CM | POA: Diagnosis not present

## 2021-08-23 DIAGNOSIS — R079 Chest pain, unspecified: Secondary | ICD-10-CM | POA: Insufficient documentation

## 2021-08-23 DIAGNOSIS — R197 Diarrhea, unspecified: Secondary | ICD-10-CM | POA: Insufficient documentation

## 2021-08-23 DIAGNOSIS — E876 Hypokalemia: Secondary | ICD-10-CM | POA: Insufficient documentation

## 2021-08-23 LAB — CBC
HCT: 38 % (ref 36.0–46.0)
Hemoglobin: 12 g/dL (ref 12.0–15.0)
MCH: 24 pg — ABNORMAL LOW (ref 26.0–34.0)
MCHC: 31.6 g/dL (ref 30.0–36.0)
MCV: 75.8 fL — ABNORMAL LOW (ref 80.0–100.0)
Platelets: 183 10*3/uL (ref 150–400)
RBC: 5.01 MIL/uL (ref 3.87–5.11)
RDW: 14.5 % (ref 11.5–15.5)
WBC: 8.4 10*3/uL (ref 4.0–10.5)
nRBC: 0 % (ref 0.0–0.2)

## 2021-08-23 LAB — URINALYSIS, ROUTINE W REFLEX MICROSCOPIC
Bilirubin Urine: NEGATIVE
Glucose, UA: NEGATIVE mg/dL
Hgb urine dipstick: NEGATIVE
Ketones, ur: NEGATIVE mg/dL
Nitrite: NEGATIVE
Protein, ur: NEGATIVE mg/dL
Specific Gravity, Urine: 1.039 — ABNORMAL HIGH (ref 1.005–1.030)
pH: 6 (ref 5.0–8.0)

## 2021-08-23 LAB — BASIC METABOLIC PANEL
Anion gap: 9 (ref 5–15)
BUN: 19 mg/dL (ref 8–23)
CO2: 26 mmol/L (ref 22–32)
Calcium: 9 mg/dL (ref 8.9–10.3)
Chloride: 106 mmol/L (ref 98–111)
Creatinine, Ser: 0.75 mg/dL (ref 0.44–1.00)
GFR, Estimated: >60 mL/min (ref >60)
Glucose, Bld: 203 mg/dL — ABNORMAL HIGH (ref 70–99)
Potassium: 2.8 mmol/L — ABNORMAL LOW (ref 3.5–5.1)
Sodium: 141 mmol/L (ref 135–145)

## 2021-08-23 LAB — HEPATIC FUNCTION PANEL
ALT: 16 U/L (ref 0–44)
AST: 20 U/L (ref 15–41)
Albumin: 4.1 g/dL (ref 3.5–5.0)
Alkaline Phosphatase: 109 U/L (ref 38–126)
Bilirubin, Direct: 0.1 mg/dL (ref 0.0–0.2)
Indirect Bilirubin: 0.4 mg/dL (ref 0.3–0.9)
Total Bilirubin: 0.5 mg/dL (ref 0.3–1.2)
Total Protein: 7.1 g/dL (ref 6.5–8.1)

## 2021-08-23 LAB — TROPONIN I (HIGH SENSITIVITY)
Troponin I (High Sensitivity): 6 ng/L (ref <18)
Troponin I (High Sensitivity): 5 ng/L (ref <18)

## 2021-08-23 LAB — LIPASE, BLOOD: Lipase: 36 U/L (ref 11–51)

## 2021-08-23 MED ORDER — IOHEXOL 300 MG/ML  SOLN
100.0000 mL | Freq: Once | INTRAMUSCULAR | Status: AC | PRN
  Administered 2021-08-23: 100 mL via INTRAVENOUS

## 2021-08-23 MED ORDER — POTASSIUM CHLORIDE CRYS ER 20 MEQ PO TBCR
40.0000 meq | EXTENDED_RELEASE_TABLET | Freq: Once | ORAL | Status: AC
  Administered 2021-08-23: 40 meq via ORAL
  Filled 2021-08-23: qty 2

## 2021-08-23 MED ORDER — FAMOTIDINE IN NACL 20-0.9 MG/50ML-% IV SOLN
20.0000 mg | Freq: Once | INTRAVENOUS | Status: AC
  Administered 2021-08-23: 20 mg via INTRAVENOUS
  Filled 2021-08-23: qty 50

## 2021-08-23 MED ORDER — ONDANSETRON 4 MG PO TBDP
4.0000 mg | ORAL_TABLET | Freq: Once | ORAL | Status: AC
  Administered 2021-08-23: 4 mg via ORAL
  Filled 2021-08-23: qty 1

## 2021-08-23 MED ORDER — POTASSIUM CHLORIDE CRYS ER 20 MEQ PO TBCR: 20.0000 meq | EXTENDED_RELEASE_TABLET | Freq: Two times a day (BID) | ORAL | 0 refills | Status: DC

## 2021-08-23 MED ORDER — SODIUM CHLORIDE 0.9 % IV BOLUS
500.0000 mL | Freq: Once | INTRAVENOUS | Status: AC
  Administered 2021-08-23: 500 mL via INTRAVENOUS

## 2021-08-23 MED ORDER — HYDROMORPHONE HCL 1 MG/ML IJ SOLN
1.0000 mg | Freq: Once | INTRAMUSCULAR | Status: AC
  Administered 2021-08-23: 1 mg via INTRAVENOUS
  Filled 2021-08-23: qty 1

## 2021-08-23 MED ORDER — ACETAMINOPHEN 500 MG PO TABS: 1000.0000 mg | ORAL_TABLET | Freq: Four times a day (QID) | ORAL | 0 refills | Status: DC | PRN

## 2021-08-23 MED ORDER — ONDANSETRON 4 MG PO TBDP: 4.0000 mg | ORAL_TABLET | ORAL | 0 refills | Status: DC | PRN

## 2021-08-23 MED ORDER — FAMOTIDINE 20 MG PO TABS: 20.0000 mg | ORAL_TABLET | Freq: Two times a day (BID) | ORAL | 0 refills | Status: DC

## 2021-08-23 MED ORDER — ACETAMINOPHEN 500 MG PO TABS
1000.0000 mg | ORAL_TABLET | Freq: Once | ORAL | Status: AC
  Administered 2021-08-23: 1000 mg via ORAL
  Filled 2021-08-23: qty 2

## 2021-08-23 MED ORDER — POTASSIUM CHLORIDE 10 MEQ/100ML IV SOLN
10.0000 meq | Freq: Once | INTRAVENOUS | Status: AC
  Administered 2021-08-23: 10 meq via INTRAVENOUS
  Filled 2021-08-23: qty 100

## 2021-08-23 MED ORDER — ONDANSETRON HCL 4 MG/2ML IJ SOLN
4.0000 mg | Freq: Once | INTRAMUSCULAR | Status: AC
  Administered 2021-08-23: 4 mg via INTRAVENOUS
  Filled 2021-08-23: qty 2

## 2021-08-23 MED ORDER — SODIUM CHLORIDE 0.9 % IV BOLUS
1000.0000 mL | Freq: Once | INTRAVENOUS | Status: AC
  Administered 2021-08-23: 1000 mL via INTRAVENOUS

## 2021-08-23 NOTE — ED Provider Notes (Addendum)
[facility]-EMERGENCY DEPT Provider Note   CSN: [id number] Arrival date & time: 08/23/21  6967     History  Chief Complaint  Patient presents with   Chest Pain   Abdominal Pain   Nausea   Dizziness    [name] is a 66 y.o. female.  HPI Patient went to bed feeling well last night.  She awakened this morning with abdominal pain.  She reports she feels very nauseated.  She has had 3 episodes of diarrhea.  Patient endorses a lot of upper abdominal pain.  Cramping and aching in nature.  No fever but she has felt chills.  No pain or burning with urination.  Patient went to bed feeling well last night.  She did eat at [facility] and had calamari and prepared to dip.  Patient reports she awakened with symptoms this morning.  She reports that at first this morning she did have burning quality in her upper chest but that is since resolved and has not returned.  She did not have any associated shortness of breath.  No recent cough.  No swelling or pain in the lower extremities.    Home Medications Prior to Admission medications   Medication Sig Start Date End Date Taking? Authorizing Provider  acetaminophen (TYLENOL) 500 MG tablet Take 2 tablets (1,000 mg total) by mouth every 6 (six) hours as needed. 08/23/21  Yes Arby Barrette, MD  famotidine (PEPCID) 20 MG tablet Take 1 tablet (20 mg total) by mouth 2 (two) times daily. 08/23/21  Yes Arby Barrette, MD  ondansetron (ZOFRAN-ODT) 4 MG disintegrating tablet Take 1 tablet (4 mg total) by mouth every 4 (four) hours as needed for nausea or vomiting. 08/23/21  Yes Jaishaun Mcnab, Lebron Conners, MD  potassium chloride SA (KLOR-CON M) 20 MEQ tablet Take 1 tablet (20 mEq total) by mouth 2 (two) times daily. 08/23/21  Yes Tatym Schermer, Lebron Conners, MD  Biotin (BIOTIN 5000) 5 MG CAPS Take 2 capsules by mouth daily.     [provider]  Calcium 600-200 MG-UNIT tablet Take 1 tablet by mouth daily.    [provider]   ELDERBERRY PO Take by mouth.    [provider]  fluticasone (FLONASE) 50 MCG/ACT nasal spray Place 1-2 sprays into both nostrils daily. 05/28/20   Wieters, Hallie C, PA-C  Multiple Vitamin (MULTIVITAMIN ADULT PO) Take by mouth.    [provider]  Olmesartan-amLODIPine-HCTZ 40-5-25 MG TABS Take 40 mg by mouth daily.    [provider]  Omega-3 1000 MG CAPS Take by mouth.    [provider]  omeprazole (PRILOSEC) 40 MG capsule Take 40 mg by mouth daily.    [provider]      Allergies    Patient has no known allergies.    Review of Systems   Review of Systems 10 Systems reviewed negative except as per HPI Physical Exam Updated Vital Signs BP 140/70   Pulse 77   Temp 97.6 F (36.4 C) (Oral)   Resp 15   Ht 5\' 4"  (1.626 m)   Wt 81.2 kg   SpO2 91%   BMI 30.73 kg/m  Physical Exam Constitutional:      Comments: Alert nontoxic uncomfortable in appearance  HENT:     Head: Normocephalic and atraumatic.     Mouth/Throat:     Pharynx: Oropharynx is clear.  Eyes:     Extraocular Movements: Extraocular movements intact.     Conjunctiva/sclera: Conjunctivae normal.  Cardiovascular:  Rate and Rhythm: Normal rate and regular rhythm.  Pulmonary:     Effort: Pulmonary effort is normal.     Breath sounds: Normal breath sounds.  Abdominal:     Comments: Tender epigastrium.  Lower abdomen nontender.  No guarding.  Musculoskeletal:        General: No swelling or tenderness. Normal range of motion.     Right lower leg: No edema.     Left lower leg: No edema.  Skin:    General: Skin is warm and dry.  Neurological:     General: No focal deficit present.     Mental Status: She is oriented to person, place, and time.     Motor: No weakness.     Coordination: Coordination normal.  Psychiatric:        Mood and Affect: Mood normal.     ED Results / Procedures / Treatments   Labs (all labs ordered are listed, but only abnormal results  are displayed) Labs Reviewed  BASIC METABOLIC PANEL - Abnormal; Notable for the following components:      Result Value   Potassium 2.8 (*)    Glucose, Bld 203 (*)    All other components within normal limits  CBC - Abnormal; Notable for the following components:   MCV 75.8 (*)    MCH 24.0 (*)    All other components within normal limits  URINALYSIS, ROUTINE W REFLEX MICROSCOPIC - Abnormal; Notable for the following components:   Color, Urine STRAW (*)    Specific Gravity, Urine 1.039 (*)    Leukocytes,Ua TRACE (*)    Bacteria, UA RARE (*)    All other components within normal limits  URINE CULTURE  HEPATIC FUNCTION PANEL  LIPASE, BLOOD  TROPONIN I (HIGH SENSITIVITY)  TROPONIN I (HIGH SENSITIVITY)    EKG EKG Interpretation  Date/Time:  Friday August 23 2021 07:47:19 EDT Ventricular Rate:  71 PR Interval:  235 QRS Duration: 85 QT Interval:  435 QTC Calculation: 473 R Axis:   7 Text Interpretation: Sinus rhythm Prolonged PR interval Low voltage, precordial leads no sig change Confirmed by Arby Barrette [phone]) on 08/23/2021 8:35:21 AM  Radiology CT Abdomen Pelvis W Contrast  Result Date: 08/23/2021 CLINICAL DATA:  Generalized abdominal pain. EXAM: CT ABDOMEN AND PELVIS WITH CONTRAST TECHNIQUE: Multidetector CT imaging of the abdomen and pelvis was performed using the standard protocol following bolus administration of intravenous contrast. RADIATION DOSE REDUCTION: This exam was performed according to the departmental dose-optimization program which includes automated exposure control, adjustment of the mA and/or kV according to patient size and/or use of iterative reconstruction technique. CONTRAST:  OMNIPAQUE IOHEXOL 300 MG/ML  SOLN COMPARISON:  None Available. FINDINGS: Lower chest: No acute abnormality. Hepatobiliary: No focal liver abnormality is seen. No gallstones, gallbladder wall thickening, or biliary dilatation. Pancreas: Unremarkable. No pancreatic ductal  dilatation or surrounding inflammatory changes. Spleen: Normal in size without focal abnormality. Adrenals/Urinary Tract: The adrenal glands are unremarkable. 3.0 cm simple cyst in the upper pole of the right kidney. No follow-up imaging is recommended. No renal calculi or hydronephrosis. The bladder is unremarkable. Stomach/Bowel: Small hiatal hernia. The stomach is otherwise within normal limits. Mild circumferential wall thickening and surrounding inflammatory change involving the majority of the colon. Mild diffuse colonic diverticulosis. Unremarkable small bowel. No obstruction. Normal appendix. Vascular/Lymphatic: Aortic atherosclerosis. No enlarged abdominal or pelvic lymph nodes. Reproductive: Uterus and bilateral adnexa are unremarkable. Other: Tiny fat containing midline supraumbilical and umbilical hernias. No free fluid or pneumoperitoneum.  Musculoskeletal: No acute or significant osseous findings. IMPRESSION: 1. Mild circumferential wall thickening and surrounding inflammatory change involving the majority of the colon, consistent with colitis, which could be infectious or inflammatory in etiology. 2. Aortic Atherosclerosis (ICD10-I70.0). Electronically Signed   By: Obie Dredge M.D.   On: 08/23/2021 09:41   DG Chest 2 View  Result Date: 08/23/2021 CLINICAL DATA:  Chest pain. EXAM: CHEST - 2 VIEW COMPARISON:  Chest x-ray 03/01/2011. FINDINGS: The heart size and mediastinal contours are within normal limits. Both lungs are clear. No visible pleural effusions or pneumothorax. No acute osseous abnormality. IMPRESSION: No active cardiopulmonary disease. Electronically Signed   By: Feliberto Harts M.D.   On: 08/23/2021 08:14    Procedures Procedures    Medications Ordered in ED Medications  acetaminophen (TYLENOL) tablet 1,000 mg (has no administration in time range)  ondansetron (ZOFRAN-ODT) disintegrating tablet 4 mg (has no administration in time range)  HYDROmorphone (DILAUDID)  injection 1 mg (1 mg Intravenous Given 08/23/21 0849)  sodium chloride 0.9 % bolus 1,000 mL (0 mLs Intravenous Stopped 08/23/21 0950)  ondansetron (ZOFRAN) injection 4 mg (4 mg Intravenous Given 08/23/21 0849)  famotidine (PEPCID) IVPB 20 mg premix (0 mg Intravenous Stopped 08/23/21 0920)  iohexol (OMNIPAQUE) 300 MG/ML solution 100 mL (100 mLs Intravenous Contrast Given 08/23/21 0925)  potassium chloride SA (KLOR-CON M) CR tablet 40 mEq (40 mEq Oral Given 08/23/21 1013)  potassium chloride 10 mEq in 100 mL IVPB (0 mEq Intravenous Stopped 08/23/21 1115)  sodium chloride 0.9 % bolus 500 mL (0 mLs Intravenous Stopped 08/23/21 1131)    ED Course/ Medical Decision Making/ A&P                           Medical Decision Making Amount and/or Complexity of Data Reviewed Labs: ordered. Radiology: ordered.  Risk OTC drugs. Prescription drug management.  Presents with acute onset of abdominal pain this morning.  Also some associated diarrhea.  Nausea.  Patient has significant reproducible pain epigastrium and right upper quadrant.  We will proceed with hydration, pain and nausea control and diagnostic evaluation.  Patient given lactated Ringer's, Zofran, Dilaudid and Pepcid.  Differential diagnosis includes cholecystitis, pancreatitis, bowel obstruction, gastroenteritis, appendicitis.  We will proceed with CT scan.  Lab results without significant abnormality.  Patient does not have any acute anemia, leukocytosis, lipase and LFTs within normal limits.  Patient rechecked after 1 L of fluids.  She does feel improvement.  At this time she feels that she can get up to try to provide a urine specimen.  Will obtain orthostatic vital signs at this time.  Patient does have hypokalemia.  Will initiate potassium replacement and an additional liter of fluids for rehydration.  CT scan shows diffuse colonic thickening.  Per radiology inflammatory versus infectious.  No other acute findings.  Without leukocytosis and  otherwise normal vital signs, higher suspicion for viral etiology of colitis.  Will not initiate empiric antibiotics at this time.  Lab results and diagnostic results extensively reviewed with the patient's daughter and family member at bedside.  Extensively reviewed follow-up plan, home management and return precautions.  Patient stable for discharge.  Vital signs have been stable.  She has not had any further vomiting or diarrhea since in the emergency department plan will be for oral hydration and control of symptoms with Zofran, Pepcid and Tylenol at home.  Patient is also counseled on use of supplemental potassium and necessity for close monitoring  by her PCP.  Patient does take hydrochlorothiazide\olmesartan\amlodipine combo for blood pressure control she will hold today's dose and monitor her blood pressures at home.         Final Clinical Impression(s) / ED Diagnoses Final diagnoses:  Diarrhea of presumed infectious origin  Pain of upper abdomen  Hypokalemia  Dehydration    Rx / DC Orders ED Discharge Orders          Ordered    ondansetron (ZOFRAN-ODT) 4 MG disintegrating tablet  Every 4 hours PRN        08/23/21 1255    famotidine (PEPCID) 20 MG tablet  2 times daily        08/23/21 1255    acetaminophen (TYLENOL) 500 MG tablet  Every 6 hours PRN        08/23/21 1255    potassium chloride SA (KLOR-CON M) 20 MEQ tablet  2 times daily        08/23/21 1258              Arby Barrette, MD 08/23/21 1003    Arby Barrette, MD 08/23/21 1301

## 2021-08-23 NOTE — Discharge Instructions (Addendum)
1.  Stay hydrated.  Read instructions under rehydration.  Also follow a very low-fat diet as described under gallbladder eating plan. 2.  Take Zofran as prescribed every 4 hours as needed for nausea or vomiting.  Take potassium as prescribed for the next week.  You need to have a recheck with your doctor within the next 4 to 7 days.  They will need to reevaluate your treatment and monitoring for diabetes and also if your blood pressure medication is in your potassium low. 3.  Return to the emergency department immediately if you develop a fever, worsening pain, cannot stay hydrated due to nausea vomiting or inability to eat or drink.  Or other concerning changes.

## 2021-08-23 NOTE — ED Notes (Signed)
Pt states understanding of dc instructions, importance of follow up, and prescriptions. Pt denies questions or concerns upon dc. Pt assisted into wheelchair and wheeled out to ed front entrance to wait for visitor to pull car around. No belongings left in room upon dc.

## 2021-08-23 NOTE — ED Triage Notes (Signed)
Patient reports that she woke this AM with mid chest pain, dizziness, nausea, and generalized abdominal pain.

## 2021-08-24 ENCOUNTER — Other Ambulatory Visit: Payer: Self-pay

## 2021-08-24 ENCOUNTER — Emergency Department (HOSPITAL_COMMUNITY)
Admission: EM | Admit: 2021-08-24 | Discharge: 2021-08-24 | Disposition: A | Discharge status: Home / Self Care | Payer: Medicare Other | Attending: Emergency Medicine | Admitting: Emergency Medicine

## 2021-08-24 ENCOUNTER — Encounter (HOSPITAL_COMMUNITY): Payer: Self-pay

## 2021-08-24 DIAGNOSIS — K648 Other hemorrhoids: Secondary | ICD-10-CM | POA: Insufficient documentation

## 2021-08-24 DIAGNOSIS — Z79899 Other long term (current) drug therapy: Secondary | ICD-10-CM | POA: Diagnosis not present

## 2021-08-24 DIAGNOSIS — I10 Essential (primary) hypertension: Secondary | ICD-10-CM | POA: Insufficient documentation

## 2021-08-24 DIAGNOSIS — K2900 Acute gastritis without bleeding: Secondary | ICD-10-CM | POA: Insufficient documentation

## 2021-08-24 DIAGNOSIS — K625 Hemorrhage of anus and rectum: Secondary | ICD-10-CM | POA: Diagnosis present

## 2021-08-24 LAB — CBC
HCT: 36.2 % (ref 36.0–46.0)
Hemoglobin: 11.7 g/dL — ABNORMAL LOW (ref 12.0–15.0)
MCH: 24 pg — ABNORMAL LOW (ref 26.0–34.0)
MCHC: 32.3 g/dL (ref 30.0–36.0)
MCV: 74.2 fL — ABNORMAL LOW (ref 80.0–100.0)
Platelets: 199 10*3/uL (ref 150–400)
RBC: 4.88 MIL/uL (ref 3.87–5.11)
RDW: 14.6 % (ref 11.5–15.5)
WBC: 16.7 10*3/uL — ABNORMAL HIGH (ref 4.0–10.5)
nRBC: 0 % (ref 0.0–0.2)

## 2021-08-24 LAB — URINALYSIS, ROUTINE W REFLEX MICROSCOPIC
Bacteria, UA: NONE SEEN
Bilirubin Urine: NEGATIVE
Glucose, UA: NEGATIVE mg/dL
Ketones, ur: NEGATIVE mg/dL
Leukocytes,Ua: NEGATIVE
Nitrite: NEGATIVE
Protein, ur: NEGATIVE mg/dL
Specific Gravity, Urine: 1.008 (ref 1.005–1.030)
pH: 5 (ref 5.0–8.0)

## 2021-08-24 LAB — COMPREHENSIVE METABOLIC PANEL
ALT: 18 U/L (ref 0–44)
AST: 24 U/L (ref 15–41)
Albumin: 4.1 g/dL (ref 3.5–5.0)
Alkaline Phosphatase: 84 U/L (ref 38–126)
Anion gap: 7 (ref 5–15)
BUN: 11 mg/dL (ref 8–23)
CO2: 25 mmol/L (ref 22–32)
Calcium: 9.3 mg/dL (ref 8.9–10.3)
Chloride: 106 mmol/L (ref 98–111)
Creatinine, Ser: 0.86 mg/dL (ref 0.44–1.00)
GFR, Estimated: >60 mL/min (ref >60)
Glucose, Bld: 133 mg/dL — ABNORMAL HIGH (ref 70–99)
Potassium: 3.5 mmol/L (ref 3.5–5.1)
Sodium: 138 mmol/L (ref 135–145)
Total Bilirubin: 0.8 mg/dL (ref 0.3–1.2)
Total Protein: 7.7 g/dL (ref 6.5–8.1)

## 2021-08-24 LAB — URINE CULTURE: Culture: <10000 — AB

## 2021-08-24 LAB — POC OCCULT BLOOD, ED: Fecal Occult Bld: POSITIVE — AB

## 2021-08-24 MED ORDER — ONDANSETRON HCL 4 MG PO TABS: 4.0000 mg | ORAL_TABLET | Freq: Four times a day (QID) | ORAL | 0 refills | Status: DC

## 2021-08-24 MED ORDER — DICYCLOMINE HCL 20 MG PO TABS: 20.0000 mg | ORAL_TABLET | Freq: Two times a day (BID) | ORAL | 0 refills | Status: DC | PRN

## 2021-08-24 NOTE — ED Triage Notes (Signed)
Patient was seen yesterday thinking she had a stomach virus/ food poisoning. Then went home, stomach still bloated. Tonight she said every time she has used the bathroom she has bright red blood coming out of her bottom.

## 2021-08-24 NOTE — ED Provider Notes (Signed)
[facility] DEPT Provider Note   CSN: [id number] Arrival date & time: 08/24/21  0351     History  Chief Complaint  Patient presents with   Rectal Bleeding    [name] is a 66 y.o. female.  HPI  Medical history including GERD, hypertension, sleep apnea with complaints of rectal bleeding.  Patient states that 2 days ago she ate at [facility] had calamari, she then developed abdominal pain as well as nausea vomiting diarrhea.  Patient has she had multiple episode of diarrhea, states that she went to the emergency department and then was sent home.  She states now she is having some rectal bleeding, she has had no more diarrhea, states that she noticed bright red blood every time she urinates but she denies vaginal bleeding, states it is coming from her rectum, she denies any urinary symptoms, no history of GI bleeds, not on anticoag's.  She has no other complaints.  Reviewed patient's chart was seen yesterday, she had lab work imaging which were unremarkable, and was discharged home with symptom management.  Home Medications Prior to Admission medications   Medication Sig Start Date End Date Taking? Authorizing Provider  acetaminophen (TYLENOL) 500 MG tablet Take 2 tablets (1,000 mg total) by mouth every 6 (six) hours as needed. Patient taking differently: Take 1,000 mg by mouth every 6 (six) hours as needed for moderate pain. 08/23/21  Yes Charlesetta Shanks, MD  Calcium 600-200 MG-UNIT tablet Take 1 tablet by mouth daily.   Yes [provider]  dicyclomine (BENTYL) 20 MG tablet Take 1 tablet (20 mg total) by mouth 2 (two) times daily as needed for spasms. 08/24/21  Yes Marcello Fennel, PA-C  esomeprazole (NEXIUM) 40 MG capsule Take 40 mg by mouth daily. 07/31/21  Yes [provider]  famotidine (PEPCID) 20 MG tablet Take 1 tablet (20 mg total) by mouth 2 (two) times daily. 08/23/21  Yes Charlesetta Shanks, MD  fluticasone  (FLONASE) 50 MCG/ACT nasal spray Place 1-2 sprays into both nostrils daily. Patient taking differently: Place 1-2 sprays into both nostrils daily as needed for allergies. 05/28/20  Yes Wieters, Hallie C, PA-C  Olmesartan-amLODIPine-HCTZ 40-5-25 MG TABS Take 1 tablet by mouth daily.   Yes [provider]  ondansetron (ZOFRAN) 4 MG tablet Take 1 tablet (4 mg total) by mouth every 6 (six) hours. 08/24/21  Yes Marcello Fennel, PA-C  ondansetron (ZOFRAN-ODT) 4 MG disintegrating tablet Take 1 tablet (4 mg total) by mouth every 4 (four) hours as needed for nausea or vomiting. Patient not taking: Reported on 08/24/2021 08/23/21   Charlesetta Shanks, MD  potassium chloride SA (KLOR-CON M) 20 MEQ tablet Take 1 tablet (20 mEq total) by mouth 2 (two) times daily. Patient not taking: Reported on 08/24/2021 08/23/21   Charlesetta Shanks, MD      Allergies    Patient has no known allergies.    Review of Systems   Review of Systems  Constitutional:  Negative for chills and fever.  Respiratory:  Negative for shortness of breath.   Cardiovascular:  Negative for chest pain.  Gastrointestinal:  Positive for blood in stool. Negative for abdominal pain, anal bleeding, diarrhea and vomiting.  Neurological:  Negative for headaches.    Physical Exam Updated Vital Signs BP 140/80   Pulse 83   Temp 98.2 F (36.8 C) (Oral)   Resp 16   SpO2 94%  Physical Exam Vitals and nursing note reviewed. Exam conducted with a chaperone  present.  Constitutional:      General: She is not in acute distress.    Appearance: She is not ill-appearing.  HENT:     Head: Normocephalic and atraumatic.     Nose: No congestion.  Eyes:     Conjunctiva/sclera: Conjunctivae normal.  Cardiovascular:     Rate and Rhythm: Normal rate and regular rhythm.     Pulses: Normal pulses.     Heart sounds: No murmur heard.    No friction rub. No gallop.  Pulmonary:     Effort: No respiratory distress.     Breath sounds: No wheezing,  rhonchi or rales.  Abdominal:     Palpations: Abdomen is soft.     Tenderness: There is no abdominal tenderness. There is no right CVA tenderness or left CVA tenderness.  Genitourinary:    Rectum: Normal. Guaiac result positive.     Comments: With chaperone present rectal exam was performed, she is an external hemorrhoid nonthrombosed, noted in the 12 o'clock position, no blood noted around the rectum, digital rectal exam was performed, there is no large stool burden, no palpable masses, she had slight hematochezia present, Hemoccult was positive. Skin:    General: Skin is warm and dry.  Neurological:     Mental Status: She is alert.  Psychiatric:        Mood and Affect: Mood normal.     ED Results / Procedures / Treatments   Labs (all labs ordered are listed, but only abnormal results are displayed) Labs Reviewed  CBC - Abnormal; Notable for the following components:      Result Value   WBC 16.7 (*)    Hemoglobin 11.7 (*)    MCV 74.2 (*)    MCH 24.0 (*)    All other components within normal limits  COMPREHENSIVE METABOLIC PANEL - Abnormal; Notable for the following components:   Glucose, Bld 133 (*)    All other components within normal limits  URINALYSIS, ROUTINE W REFLEX MICROSCOPIC - Abnormal; Notable for the following components:   Color, Urine STRAW (*)    Hgb urine dipstick SMALL (*)    All other components within normal limits  POC OCCULT BLOOD, ED - Abnormal; Notable for the following components:   Fecal Occult Bld POSITIVE (*)    All other components within normal limits    EKG None  Radiology CT Abdomen Pelvis W Contrast  Result Date: 08/23/2021 CLINICAL DATA:  Generalized abdominal pain. EXAM: CT ABDOMEN AND PELVIS WITH CONTRAST TECHNIQUE: Multidetector CT imaging of the abdomen and pelvis was performed using the standard protocol following bolus administration of intravenous contrast. RADIATION DOSE REDUCTION: This exam was performed according to the  departmental dose-optimization program which includes automated exposure control, adjustment of the mA and/or kV according to patient size and/or use of iterative reconstruction technique. CONTRAST:  122mL OMNIPAQUE IOHEXOL 300 MG/ML  SOLN COMPARISON:  None Available. FINDINGS: Lower chest: No acute abnormality. Hepatobiliary: No focal liver abnormality is seen. No gallstones, gallbladder wall thickening, or biliary dilatation. Pancreas: Unremarkable. No pancreatic ductal dilatation or surrounding inflammatory changes. Spleen: Normal in size without focal abnormality. Adrenals/Urinary Tract: The adrenal glands are unremarkable. 3.0 cm simple cyst in the upper pole of the right kidney. No follow-up imaging is recommended. No renal calculi or hydronephrosis. The bladder is unremarkable. Stomach/Bowel: Small hiatal hernia. The stomach is otherwise within normal limits. Mild circumferential wall thickening and surrounding inflammatory change involving the majority of the colon. Mild diffuse colonic diverticulosis. Unremarkable small  bowel. No obstruction. Normal appendix. Vascular/Lymphatic: Aortic atherosclerosis. No enlarged abdominal or pelvic lymph nodes. Reproductive: Uterus and bilateral adnexa are unremarkable. Other: Tiny fat containing midline supraumbilical and umbilical hernias. No free fluid or pneumoperitoneum. Musculoskeletal: No acute or significant osseous findings. IMPRESSION: 1. Mild circumferential wall thickening and surrounding inflammatory change involving the majority of the colon, consistent with colitis, which could be infectious or inflammatory in etiology. 2. Aortic Atherosclerosis (ICD10-I70.0). Electronically Signed   By: Titus Dubin M.D.   On: 08/23/2021 09:41   DG Chest 2 View  Result Date: 08/23/2021 CLINICAL DATA:  Chest pain. EXAM: CHEST - 2 VIEW COMPARISON:  Chest x-ray 03/01/2011. FINDINGS: The heart size and mediastinal contours are within normal limits. Both lungs are clear.  No visible pleural effusions or pneumothorax. No acute osseous abnormality. IMPRESSION: No active cardiopulmonary disease. Electronically Signed   By: Margaretha Sheffield M.D.   On: 08/23/2021 08:14    Procedures Procedures    Medications Ordered in ED Medications - No data to display  ED Course/ Medical Decision Making/ A&P                           Medical Decision Making Amount and/or Complexity of Data Reviewed Labs: ordered.   This patient presents to the ED for concern of rectal bleeding, this involves an extensive number of treatment options, and is a complaint that carries with it a high risk of complications and morbidity.  The differential diagnosis includes thrombosed hemorrhoid, lower GI bleed, diverticulitis, bowel obstruction    Additional history obtained:  Additional history obtained from N/A External records from outside source obtained and reviewed including previous ED note   Co morbidities that complicate the patient evaluation  N/A  Social Determinants of Health:  Geriatric    Lab Tests:  I Ordered, and personally interpreted labs.  The pertinent results include: CBC shows leukocytosis of 16.7, normocytic anemia hemoglobin 11.7, CMP shows glucose of 133, UA unremarkable, Hemoccult positive   Imaging Studies ordered:  I ordered imaging studies including N/A I independently visualized and interpreted imaging which showed benign I agree with the radiologist interpretation   Cardiac Monitoring:  The patient was maintained on a cardiac monitor.  I personally viewed and interpreted the cardiac monitored which showed an underlying rhythm of: N/A   Medicines ordered and prescription drug management:  I ordered medication including N/A I have reviewed the patients home medicines and have made adjustments as needed  Critical Interventions:  N/A   Reevaluation:  Patient was reassessed resting comfortably having no complaints she is agreement  with plan and discharge at this time.  Consultations Obtained:  N/A   Test Considered:  CT and pelvis-we will defer as this was performed yesterday, was unremarkable, she has benign abdominal exam, vital signs are reassuring.  Low suspicion for intra-abdominal abnormality    Rule out  I have low suspicion for liver or gallbladder abnormality as she has no right upper quadrant tenderness, liver enzymes, alk phos, T bili all within normal limits.  Low suspicion for pancreatitis as lipase is within normal limits.  Low suspicion for ruptured stomach ulcer as she has no peritoneal sign present on exam.  Low suspicion for bowel obstruction as abdomen is nondistended normal bowel sounds, so passing gas and having normal bowel movements.  Low suspicion for complicated diverticulitis as she is nontoxic-appearing, vital signs reassuring no leukocytosis present.  Low suspicion for appendicitis as she has no  right lower quadrant tenderness, vital signs reassuring.  It is noted that she has an elevated white count, suspect this more acute phase reactant from likely gastroenteritis.  She does have positive Hemoccult, suspect she is suffering from likely internal hemorrhoids    Dispostion and problem list  After consideration of the diagnostic results and the patients response to treatment, I feel that the patent would benefit from discharge.   Rectal bleeding-likely secondary due to internal hemorrhoids, will recommend symptom management, follow-up with PCP for further evaluation. Stomach pain-likely gastroenteritis, will defer on antibiotics as likely viral nature, will provide her with symptom management, follow-up with PCP for further evaluation and strict return precautions.            Final Clinical Impression(s) / ED Diagnoses Final diagnoses:  Acute gastritis without hemorrhage, unspecified gastritis type  Internal hemorrhoids    Rx / DC Orders ED Discharge Orders           Ordered    dicyclomine (BENTYL) 20 MG tablet  2 times daily PRN        08/24/21 0632    ondansetron (ZOFRAN) 4 MG tablet  Every 6 hours        08/24/21 9923              Marcello Fennel, PA-C 08/24/21 0634    Jeanell Sparrow, DO 08/28/21 1615

## 2021-08-24 NOTE — Discharge Instructions (Signed)
Rectal bleeding-likely from internal hemorrhoids, it is important to have regular soft stools  will help decrease pressure and inflammation within your rectum.  Recommend MiraLAX and titrate to the correct stool consistency.  This only works as long as you are hydrated drink plenty of water. Stomach pain-likely gastritis, I recommend a bland diet,  also given you Bentyl for stomach spasms please take as prescribed.  Please follow-up with GI for further evaluation.  Come back to the emergency department if you develop chest pain, shortness of breath, severe abdominal pain, uncontrolled nausea, vomiting, diarrhea.

## 2021-08-26 ENCOUNTER — Encounter (HOSPITAL_COMMUNITY): Payer: Self-pay | Admitting: Oncology

## 2021-08-26 ENCOUNTER — Emergency Department (HOSPITAL_COMMUNITY)
Admission: EM | Admit: 2021-08-26 | Discharge: 2021-08-26 | Disposition: A | Discharge status: Home / Self Care | Payer: Medicare Other | Attending: Emergency Medicine | Admitting: Emergency Medicine

## 2021-08-26 ENCOUNTER — Other Ambulatory Visit (HOSPITAL_COMMUNITY): Payer: Self-pay

## 2021-08-26 ENCOUNTER — Other Ambulatory Visit: Payer: Self-pay

## 2021-08-26 DIAGNOSIS — D72829 Elevated white blood cell count, unspecified: Secondary | ICD-10-CM | POA: Diagnosis not present

## 2021-08-26 DIAGNOSIS — K625 Hemorrhage of anus and rectum: Secondary | ICD-10-CM | POA: Diagnosis not present

## 2021-08-26 DIAGNOSIS — K529 Noninfective gastroenteritis and colitis, unspecified: Secondary | ICD-10-CM | POA: Insufficient documentation

## 2021-08-26 DIAGNOSIS — R1013 Epigastric pain: Secondary | ICD-10-CM

## 2021-08-26 DIAGNOSIS — I10 Essential (primary) hypertension: Secondary | ICD-10-CM | POA: Insufficient documentation

## 2021-08-26 DIAGNOSIS — R1084 Generalized abdominal pain: Secondary | ICD-10-CM | POA: Diagnosis present

## 2021-08-26 DIAGNOSIS — Z79899 Other long term (current) drug therapy: Secondary | ICD-10-CM | POA: Diagnosis not present

## 2021-08-26 LAB — CBC WITH DIFFERENTIAL/PLATELET
Abs Immature Granulocytes: 0.08 10*3/uL — ABNORMAL HIGH (ref 0.00–0.07)
Basophils Absolute: 0 10*3/uL (ref 0.0–0.1)
Basophils Relative: 0 %
Eosinophils Absolute: 0.1 10*3/uL (ref 0.0–0.5)
Eosinophils Relative: 1 %
HCT: 37.9 % (ref 36.0–46.0)
Hemoglobin: 11.8 g/dL — ABNORMAL LOW (ref 12.0–15.0)
Immature Granulocytes: 1 %
Lymphocytes Relative: 12 %
Lymphs Abs: 1.9 10*3/uL (ref 0.7–4.0)
MCH: 23.6 pg — ABNORMAL LOW (ref 26.0–34.0)
MCHC: 31.1 g/dL (ref 30.0–36.0)
MCV: 75.6 fL — ABNORMAL LOW (ref 80.0–100.0)
Monocytes Absolute: 1 10*3/uL (ref 0.1–1.0)
Monocytes Relative: 7 %
Neutro Abs: 12.3 10*3/uL — ABNORMAL HIGH (ref 1.7–7.7)
Neutrophils Relative %: 79 %
Platelets: 191 10*3/uL (ref 150–400)
RBC: 5.01 MIL/uL (ref 3.87–5.11)
RDW: 14.8 % (ref 11.5–15.5)
WBC: 15.4 10*3/uL — ABNORMAL HIGH (ref 4.0–10.5)
nRBC: 0 % (ref 0.0–0.2)

## 2021-08-26 LAB — BASIC METABOLIC PANEL
Anion gap: 9 (ref 5–15)
BUN: 14 mg/dL (ref 8–23)
CO2: 24 mmol/L (ref 22–32)
Calcium: 9 mg/dL (ref 8.9–10.3)
Chloride: 105 mmol/L (ref 98–111)
Creatinine, Ser: 0.78 mg/dL (ref 0.44–1.00)
GFR, Estimated: >60 mL/min (ref >60)
Glucose, Bld: 98 mg/dL (ref 70–99)
Potassium: 4.7 mmol/L (ref 3.5–5.1)
Sodium: 138 mmol/L (ref 135–145)

## 2021-08-26 MED ORDER — SODIUM CHLORIDE 0.9 % IV BOLUS
250.0000 mL | Freq: Once | INTRAVENOUS | Status: AC
  Administered 2021-08-26: 250 mL via INTRAVENOUS

## 2021-08-26 MED ORDER — OXYCODONE-ACETAMINOPHEN 5-325 MG PO TABS
ORAL_TABLET | ORAL | 0 refills | Status: DC
  Filled 2021-08-26: qty 20, 5d supply, fill #0

## 2021-08-26 NOTE — ED Triage Notes (Signed)
Pt presents d/t continued rectal bleeding. Pt also endorsing lower abdominal cramping.  Pt seen for the same.

## 2021-08-26 NOTE — ED Provider Notes (Signed)
[facility]-EMERGENCY DEPT Provider Note   CSN: [id number] Arrival date & time: 08/26/21  4098     History  Chief Complaint  Patient presents with   Abdominal Pain    [name] is a 66 y.o. female.  Patient was seen 4 days ago with diarrhea and had a CT scan that showed some colitis.  She had a normal white blood count and they did not start her on antibiotics.  She started having some bleeding Saturday and came to the emergency department and they gave her some Bentyl.  Patient is trying to get into see the GI doctor and stated she had some lower abdominal discomfort and bleeding again today.  The history is provided by the patient and medical records. No language interpreter was used.  Abdominal Pain Pain location:  Generalized Pain quality: aching   Pain radiates to:  Does not radiate Pain severity:  Mild Onset quality:  Sudden Timing:  Intermittent Progression:  Waxing and waning Chronicity:  New Context: not alcohol use   Associated symptoms: no chest pain, no cough, no diarrhea, no fatigue and no hematuria        Home Medications Prior to Admission medications   Medication Sig Start Date End Date Taking? Authorizing Provider  oxyCODONE-acetaminophen (PERCOCET) 5-325 MG tablet Take 1 pill every 6 hours for pain that is not relieved by the Bentyl and Tylenol. 08/26/21  Yes Bethann Berkshire, MD  acetaminophen (TYLENOL) 500 MG tablet Take 2 tablets (1,000 mg total) by mouth every 6 (six) hours as needed. Patient taking differently: Take 1,000 mg by mouth every 6 (six) hours as needed for moderate pain. 08/23/21   Arby Barrette, MD  Calcium 600-200 MG-UNIT tablet Take 1 tablet by mouth daily.    [provider]  dicyclomine (BENTYL) 20 MG tablet Take 1 tablet (20 mg total) by mouth 2 (two) times daily as needed for spasms. 08/24/21   Carroll Sage, PA-C  esomeprazole (NEXIUM) 40 MG capsule Take 40 mg by mouth daily. 07/31/21    [provider]  famotidine (PEPCID) 20 MG tablet Take 1 tablet (20 mg total) by mouth 2 (two) times daily. 08/23/21   Arby Barrette, MD  fluticasone (FLONASE) 50 MCG/ACT nasal spray Place 1-2 sprays into both nostrils daily. Patient taking differently: Place 1-2 sprays into both nostrils daily as needed for allergies. 05/28/20   Wieters, Hallie C, PA-C  Olmesartan-amLODIPine-HCTZ 40-5-25 MG TABS Take 1 tablet by mouth daily.    [provider]  ondansetron (ZOFRAN) 4 MG tablet Take 1 tablet (4 mg total) by mouth every 6 (six) hours. 08/24/21   Carroll Sage, PA-C  ondansetron (ZOFRAN-ODT) 4 MG disintegrating tablet Take 1 tablet (4 mg total) by mouth every 4 (four) hours as needed for nausea or vomiting. Patient not taking: Reported on 08/24/2021 08/23/21   Arby Barrette, MD  potassium chloride SA (KLOR-CON M) 20 MEQ tablet Take 1 tablet (20 mEq total) by mouth 2 (two) times daily. Patient not taking: Reported on 08/24/2021 08/23/21   Arby Barrette, MD      Allergies    Patient has no known allergies.    Review of Systems   Review of Systems  Constitutional:  Negative for appetite change and fatigue.  HENT:  Negative for congestion, ear discharge and sinus pressure.   Eyes:  Negative for discharge.  Respiratory:  Negative for cough.   Cardiovascular:  Negative for chest pain.  Gastrointestinal:  Positive for abdominal pain.  Negative for diarrhea.  Genitourinary:  Negative for frequency and hematuria.  Musculoskeletal:  Negative for back pain.  Skin:  Negative for rash.  Neurological:  Negative for seizures and headaches.  Psychiatric/Behavioral:  Negative for hallucinations.     Physical Exam Updated Vital Signs BP (!) 137/93   Pulse 92   Temp 98.1 F (36.7 C) (Oral)   Resp 20   SpO2 93%  Physical Exam Vitals and nursing note reviewed.  Constitutional:      Appearance: She is well-developed.  HENT:     Head: Normocephalic.     Mouth/Throat:      Mouth: Mucous membranes are moist.  Eyes:     General: No scleral icterus.    Conjunctiva/sclera: Conjunctivae normal.  Neck:     Thyroid: No thyromegaly.  Cardiovascular:     Rate and Rhythm: Normal rate and regular rhythm.     Heart sounds: No murmur heard.    No friction rub. No gallop.  Pulmonary:     Breath sounds: No stridor. No wheezing or rales.  Chest:     Chest wall: No tenderness.  Abdominal:     General: There is no distension.     Tenderness: There is abdominal tenderness. There is no rebound.  Musculoskeletal:        General: Normal range of motion.     Cervical back: Neck supple.  Lymphadenopathy:     Cervical: No cervical adenopathy.  Skin:    Findings: No erythema or rash.  Neurological:     Mental Status: She is alert and oriented to person, place, and time.     Motor: No abnormal muscle tone.     Coordination: Coordination normal.  Psychiatric:        Behavior: Behavior normal.     ED Results / Procedures / Treatments   Labs (all labs ordered are listed, but only abnormal results are displayed) Labs Reviewed  CBC WITH DIFFERENTIAL/PLATELET - Abnormal; Notable for the following components:      Result Value   WBC 15.4 (*)    Hemoglobin 11.8 (*)    MCV 75.6 (*)    MCH 23.6 (*)    Neutro Abs 12.3 (*)    Abs Immature Granulocytes 0.08 (*)    All other components within normal limits  BASIC METABOLIC PANEL    EKG None  Radiology No results found.  Procedures Procedures    Medications Ordered in ED Medications  sodium chloride 0.9 % bolus 250 mL (0 mLs Intravenous Stopped 08/26/21 1107)    ED Course/ Medical Decision Making/ A&P I spoke with Dr. Bosie Clos about this patient and he wanted to hold off on any antibiotics.  He stated to just treat her symptomatically with nausea and pain medicine and he will arrange for her to be seen in the office this week and eventually get a colonoscopy                         Medical Decision  Making Amount and/or Complexity of Data Reviewed Labs: ordered.  Risk Prescription drug management.  This patient presents to the ED for concern of abdominal pain and bleeding, this involves an extensive number of treatment options, and is a complaint that carries with it a high risk of complications and morbidity.  The differential diagnosis includes colitis   Co morbidities that complicate the patient evaluation  Hypertension   Additional history obtained:  Additional history obtained from family External records  from outside source obtained and reviewed including hospital records   Lab Tests:  I Ordered, and personally interpreted labs.  The pertinent results include: CBC shows white blood count elevated at 15 and hemoglobin stable 11.8   Imaging Studies ordered:  no imaging  Cardiac Monitoring: / EKG:  The patient was maintained on a cardiac monitor.  I personally viewed and interpreted the cardiac monitored which showed an underlying rhythm of: Normal sinus rhythm   Consultations Obtained:  I requested consultation with the GI Dr. Bosie Clos,  and discussed lab and imaging findings as well as pertinent plan - they recommend: Supportive care and close follow-up   Problem List / ED Course / Critical interventions / Medication management  Hypertension and colitis I ordered medication including Percocet for pain Reevaluation of the patient after these medicines showed that the patient stayed the same I have reviewed the patients home medicines and have made adjustments as needed   Social Determinants of Health:  None   Test / Admission - Considered:  Colonoscopy as outpatient  Abdominal pain with rectal bleeding mild colitis.  Patient will continue taking the Bentyl and Tylenol and also was given Percocet for severe pain        Final Clinical Impression(s) / ED Diagnoses Final diagnoses:  None    Rx / DC Orders ED Discharge Orders           Ordered    oxyCODONE-acetaminophen (PERCOCET) 5-325 MG tablet        08/26/21 1110              Bethann Berkshire, MD 08/26/21 1113

## 2021-08-26 NOTE — Discharge Instructions (Signed)
Follow-up with [facility].  They should call you today or tomorrow and set up an appointment.  You can take the Percocet for pain if the Bentyl and Tylenol does not take care of the discomfort.  Return to the emergency department if you start having profuse bleeding,  fever, or severe pain is not relieved by the medication

## 2022-01-30 ENCOUNTER — Other Ambulatory Visit: Payer: Self-pay

## 2022-01-30 ENCOUNTER — Ambulatory Visit
Admission: EM | Admit: 2022-01-30 | Discharge: 2022-01-30 | Disposition: A | Discharge status: Home / Self Care | Payer: Medicare Other | Attending: Family Medicine | Admitting: Family Medicine

## 2022-01-30 ENCOUNTER — Encounter: Payer: Self-pay | Admitting: Emergency Medicine

## 2022-01-30 DIAGNOSIS — J069 Acute upper respiratory infection, unspecified: Secondary | ICD-10-CM | POA: Diagnosis not present

## 2022-01-30 DIAGNOSIS — H9203 Otalgia, bilateral: Secondary | ICD-10-CM | POA: Diagnosis not present

## 2022-01-30 DIAGNOSIS — R0981 Nasal congestion: Secondary | ICD-10-CM | POA: Diagnosis present

## 2022-01-30 DIAGNOSIS — Z1152 Encounter for screening for COVID-19: Secondary | ICD-10-CM | POA: Diagnosis not present

## 2022-01-30 MED ORDER — BENZONATATE 100 MG PO CAPS: 100.0000 mg | ORAL_CAPSULE | Freq: Three times a day (TID) | ORAL | 0 refills | Status: DC | PRN

## 2022-01-30 NOTE — ED Triage Notes (Signed)
Pt here for bilateral ear pain and fullness, nasal congestion with some blood noted when blowing nose and cough x 3 days

## 2022-01-30 NOTE — Discharge Instructions (Signed)
Take benzonatate 100 mg, 1 tab every 8 hours as needed for cough.   You have been swabbed for COVID, and the test will result in the next 24 hours. Our staff will call you if positive. If the COVID test is positive, you should quarantine for 5 days from the start of your symptoms

## 2022-01-30 NOTE — ED Provider Notes (Signed)
[facility]    CSN: [id number] Arrival date & time: 01/30/22  1758      History   Chief Complaint Chief Complaint  Patient presents with   Nasal Congestion   Otalgia    HPI [name] is a 66 y.o. female.    Otalgia  Here for nasal congestion, cough, and bilateral ear pain.  She has not had any fever.  No vomiting or diarrhea.  No nausea.  She has had some bloody nasal discharge at times  Symptoms began about December 25.  She was possibly exposed to a couple of her grandkids who had had an upper respiratory infection prior to that.  No shortness of breath  Past Medical History:  Diagnosis Date   GERD (gastroesophageal reflux disease)    Hypertension    OSA (obstructive sleep apnea) 06/25/2017    Patient Active Problem List   Diagnosis Date Noted   Morbid obesity ([place]) 09/10/2020   OSA (obstructive sleep apnea) 06/25/2017    Past Surgical History:  Procedure Laterality Date   TONSILLECTOMY      OB History   No obstetric history on file.      Home Medications    Prior to Admission medications   Medication Sig Start Date End Date Taking? Authorizing Provider  benzonatate (TESSALON) 100 MG capsule Take 1 capsule (100 mg total) by mouth 3 (three) times daily as needed for cough. 01/30/22  Yes Barrett Henle, MD  acetaminophen (TYLENOL) 500 MG tablet Take 2 tablets (1,000 mg total) by mouth every 6 (six) hours as needed. Patient taking differently: Take 1,000 mg by mouth every 6 (six) hours as needed for moderate pain. 08/23/21   Charlesetta Shanks, MD  Calcium 600-200 MG-UNIT tablet Take 1 tablet by mouth daily.    [provider]  dicyclomine (BENTYL) 20 MG tablet Take 1 tablet (20 mg total) by mouth 2 (two) times daily as needed for spasms. 08/24/21   Marcello Fennel, PA-C  esomeprazole (NEXIUM) 40 MG capsule Take 40 mg by mouth daily. 07/31/21   [provider]  famotidine (PEPCID) 20 MG tablet Take 1 tablet (20 mg  total) by mouth 2 (two) times daily. 08/23/21   Charlesetta Shanks, MD  fluticasone (FLONASE) 50 MCG/ACT nasal spray Place 1-2 sprays into both nostrils daily. Patient taking differently: Place 1-2 sprays into both nostrils daily as needed for allergies. 05/28/20   Wieters, Hallie C, PA-C  Olmesartan-amLODIPine-HCTZ 40-5-25 MG TABS Take 1 tablet by mouth daily.    [provider]  ondansetron (ZOFRAN) 4 MG tablet Take 1 tablet (4 mg total) by mouth every 6 (six) hours. 08/24/21   Marcello Fennel, PA-C    Family History Family History  Problem Relation Age of Onset   Breast cancer Maternal Aunt    Lung cancer Mother    Emphysema Father     Social History Social History   Tobacco Use   Smoking status: Never   Smokeless tobacco: Never  Vaping Use   Vaping Use: Never used  Substance Use Topics   Alcohol use: Yes   Drug use: Never     Allergies   Patient has no known allergies.   Review of Systems Review of Systems  HENT:  Positive for ear pain.      Physical Exam Triage Vital Signs ED Triage Vitals [01/30/22 1921]  Enc Vitals Group     BP 108/71     Pulse Rate (!) 102  Resp 18     Temp 99.5 F (37.5 C)     Temp Source Oral     SpO2 95 %     Weight      Height      Head Circumference      Peak Flow      Pain Score 4     Pain Loc      Pain Edu?      Excl. in [place]?    No data found.  Updated Vital Signs BP 108/71 (BP Location: Left Arm)   Pulse (!) 102   Temp 99.5 F (37.5 C) (Oral)   Resp 18   SpO2 95%   Visual Acuity Right Eye Distance:   Left Eye Distance:   Bilateral Distance:    Right Eye Near:   Left Eye Near:    Bilateral Near:     Physical Exam Vitals reviewed.  Constitutional:      General: She is not in acute distress.    Appearance: She is not toxic-appearing.  HENT:     Right Ear: Tympanic membrane and ear canal normal.     Left Ear: Tympanic membrane and ear canal normal.     Nose: Nose normal.     Mouth/Throat:      Mouth: Mucous membranes are moist.     Pharynx: No oropharyngeal exudate or posterior oropharyngeal erythema.  Eyes:     Extraocular Movements: Extraocular movements intact.     Conjunctiva/sclera: Conjunctivae normal.     Pupils: Pupils are equal, round, and reactive to light.  Cardiovascular:     Rate and Rhythm: Normal rate and regular rhythm.     Heart sounds: No murmur heard. Pulmonary:     Effort: Pulmonary effort is normal. No respiratory distress.     Breath sounds: No stridor. No wheezing, rhonchi or rales.  Musculoskeletal:     Cervical back: Neck supple.  Lymphadenopathy:     Cervical: No cervical adenopathy.  Skin:    Capillary Refill: Capillary refill takes less than 2 seconds.     Coloration: Skin is not jaundiced or pale.  Neurological:     General: No focal deficit present.     Mental Status: She is alert and oriented to person, place, and time.  Psychiatric:        Behavior: Behavior normal.      UC Treatments / Results  Labs (all labs ordered are listed, but only abnormal results are displayed) Labs Reviewed  SARS CORONAVIRUS 2 (TAT 6-24 HRS)    EKG   Radiology No results found.  Procedures Procedures (including critical care time)  Medications Ordered in UC Medications - No data to display  Initial Impression / Assessment and Plan / UC Course  I have reviewed the triage vital signs and the nursing notes.  Pertinent labs & imaging results that were available during my care of the patient were reviewed by me and considered in my medical decision making (see chart for details).        Swabbed for COVID, and if positive she is a candidate for Paxlovid; her EGFR in July of this year was greater than 60.  Tessalon Perles are sent for the cough Final Clinical Impressions(s) / UC Diagnoses   Final diagnoses:  Viral URI     Discharge Instructions      Take benzonatate 100 mg, 1 tab every 8 hours as needed for cough.   You have been  swabbed for COVID, and the test  will result in the next 24 hours. Our staff will call you if positive. If the COVID test is positive, you should quarantine for 5 days from the start of your symptoms       ED Prescriptions     Medication Sig Dispense Auth. Provider   benzonatate (TESSALON) 100 MG capsule Take 1 capsule (100 mg total) by mouth 3 (three) times daily as needed for cough. 21 capsule Barrett Henle, MD      PDMP not reviewed this encounter.   Barrett Henle, MD 01/30/22 [id number]

## 2022-02-01 LAB — SARS CORONAVIRUS 2 (TAT 6-24 HRS): SARS Coronavirus 2: NEGATIVE

## 2022-05-29 ENCOUNTER — Emergency Department (HOSPITAL_COMMUNITY)
Admission: EM | Admit: 2022-05-29 | Discharge: 2022-05-29 | Disposition: A | Discharge status: Home / Self Care | Payer: Medicare Other | Attending: Emergency Medicine | Admitting: Emergency Medicine

## 2022-05-29 ENCOUNTER — Other Ambulatory Visit: Payer: Self-pay

## 2022-05-29 ENCOUNTER — Emergency Department (HOSPITAL_COMMUNITY): Payer: Medicare Other

## 2022-05-29 ENCOUNTER — Encounter (HOSPITAL_COMMUNITY): Payer: Self-pay | Admitting: Radiology

## 2022-05-29 DIAGNOSIS — E876 Hypokalemia: Secondary | ICD-10-CM | POA: Diagnosis not present

## 2022-05-29 DIAGNOSIS — Z79899 Other long term (current) drug therapy: Secondary | ICD-10-CM | POA: Insufficient documentation

## 2022-05-29 DIAGNOSIS — R110 Nausea: Secondary | ICD-10-CM | POA: Insufficient documentation

## 2022-05-29 DIAGNOSIS — D649 Anemia, unspecified: Secondary | ICD-10-CM | POA: Diagnosis not present

## 2022-05-29 DIAGNOSIS — I10 Essential (primary) hypertension: Secondary | ICD-10-CM | POA: Insufficient documentation

## 2022-05-29 DIAGNOSIS — R7309 Other abnormal glucose: Secondary | ICD-10-CM | POA: Insufficient documentation

## 2022-05-29 DIAGNOSIS — R509 Fever, unspecified: Secondary | ICD-10-CM | POA: Insufficient documentation

## 2022-05-29 DIAGNOSIS — R000 Tachycardia, unspecified: Secondary | ICD-10-CM | POA: Insufficient documentation

## 2022-05-29 DIAGNOSIS — R519 Headache, unspecified: Secondary | ICD-10-CM | POA: Diagnosis present

## 2022-05-29 DIAGNOSIS — R109 Unspecified abdominal pain: Secondary | ICD-10-CM | POA: Insufficient documentation

## 2022-05-29 DIAGNOSIS — Z20822 Contact with and (suspected) exposure to covid-19: Secondary | ICD-10-CM | POA: Diagnosis not present

## 2022-05-29 DIAGNOSIS — D72829 Elevated white blood cell count, unspecified: Secondary | ICD-10-CM | POA: Diagnosis not present

## 2022-05-29 LAB — CBC WITH DIFFERENTIAL/PLATELET
Abs Immature Granulocytes: 0.05 10*3/uL (ref 0.00–0.07)
Basophils Absolute: 0 10*3/uL (ref 0.0–0.1)
Basophils Relative: 0 %
Eosinophils Absolute: 0 10*3/uL (ref 0.0–0.5)
Eosinophils Relative: 0 %
HCT: 37.7 % (ref 36.0–46.0)
Hemoglobin: 11.9 g/dL — ABNORMAL LOW (ref 12.0–15.0)
Immature Granulocytes: 0 %
Lymphocytes Relative: 6 %
Lymphs Abs: 0.9 10*3/uL (ref 0.7–4.0)
MCH: 23.4 pg — ABNORMAL LOW (ref 26.0–34.0)
MCHC: 31.6 g/dL (ref 30.0–36.0)
MCV: 74.1 fL — ABNORMAL LOW (ref 80.0–100.0)
Monocytes Absolute: 0.4 10*3/uL (ref 0.1–1.0)
Monocytes Relative: 3 %
Neutro Abs: 13.1 10*3/uL — ABNORMAL HIGH (ref 1.7–7.7)
Neutrophils Relative %: 91 %
Platelets: 169 10*3/uL (ref 150–400)
RBC: 5.09 MIL/uL (ref 3.87–5.11)
RDW: 14.3 % (ref 11.5–15.5)
WBC: 14.5 10*3/uL — ABNORMAL HIGH (ref 4.0–10.5)
nRBC: 0 % (ref 0.0–0.2)

## 2022-05-29 LAB — URINALYSIS, ROUTINE W REFLEX MICROSCOPIC
Bilirubin Urine: NEGATIVE
Glucose, UA: NEGATIVE mg/dL
Ketones, ur: NEGATIVE mg/dL
Nitrite: NEGATIVE
Protein, ur: 30 mg/dL — AB
Specific Gravity, Urine: 1.018 (ref 1.005–1.030)
pH: 6 (ref 5.0–8.0)

## 2022-05-29 LAB — CBG MONITORING, ED: Glucose-Capillary: 166 mg/dL — ABNORMAL HIGH (ref 70–99)

## 2022-05-29 LAB — COMPREHENSIVE METABOLIC PANEL
ALT: 17 U/L (ref 0–44)
AST: 25 U/L (ref 15–41)
Albumin: 4.4 g/dL (ref 3.5–5.0)
Alkaline Phosphatase: 109 U/L (ref 38–126)
Anion gap: 12 (ref 5–15)
BUN: 18 mg/dL (ref 8–23)
CO2: 24 mmol/L (ref 22–32)
Calcium: 9.2 mg/dL (ref 8.9–10.3)
Chloride: 101 mmol/L (ref 98–111)
Creatinine, Ser: 0.85 mg/dL (ref 0.44–1.00)
GFR, Estimated: >60 mL/min (ref >60)
Glucose, Bld: 172 mg/dL — ABNORMAL HIGH (ref 70–99)
Potassium: 3.2 mmol/L — ABNORMAL LOW (ref 3.5–5.1)
Sodium: 137 mmol/L (ref 135–145)
Total Bilirubin: 0.8 mg/dL (ref 0.3–1.2)
Total Protein: 7.7 g/dL (ref 6.5–8.1)

## 2022-05-29 LAB — RESP PANEL BY RT-PCR (RSV, FLU A&B, COVID)  RVPGX2
Influenza A by PCR: NEGATIVE
Influenza B by PCR: NEGATIVE
Resp Syncytial Virus by PCR: NEGATIVE
SARS Coronavirus 2 by RT PCR: NEGATIVE

## 2022-05-29 LAB — GROUP A STREP BY PCR: Group A Strep by PCR: NOT DETECTED

## 2022-05-29 MED ORDER — SODIUM CHLORIDE 0.9 % IV BOLUS
1000.0000 mL | Freq: Once | INTRAVENOUS | Status: AC
  Administered 2022-05-29: 1000 mL via INTRAVENOUS

## 2022-05-29 MED ORDER — ONDANSETRON HCL 4 MG/2ML IJ SOLN
4.0000 mg | Freq: Once | INTRAMUSCULAR | Status: AC
  Administered 2022-05-29: 4 mg via INTRAVENOUS
  Filled 2022-05-29: qty 2

## 2022-05-29 MED ORDER — ACETAMINOPHEN 325 MG PO TABS
650.0000 mg | ORAL_TABLET | Freq: Once | ORAL | Status: AC
  Administered 2022-05-29: 650 mg via ORAL
  Filled 2022-05-29: qty 2

## 2022-05-29 MED ORDER — SODIUM CHLORIDE (PF) 0.9 % IJ SOLN
INTRAMUSCULAR | Status: AC
  Filled 2022-05-29: qty 50

## 2022-05-29 MED ORDER — IBUPROFEN 800 MG PO TABS
800.0000 mg | ORAL_TABLET | Freq: Once | ORAL | Status: AC
  Administered 2022-05-29: 800 mg via ORAL
  Filled 2022-05-29: qty 1

## 2022-05-29 MED ORDER — IOHEXOL 300 MG/ML  SOLN
100.0000 mL | Freq: Once | INTRAMUSCULAR | Status: AC | PRN
  Administered 2022-05-29: 100 mL via INTRAVENOUS

## 2022-05-29 NOTE — ED Notes (Addendum)
Pt ambulated over 30 feet and showed no distress.  Pt 's O2 was at 96% and went down to 92%. It bounced back immediately.  Pt stated she felt fine while ambulated and had no problem with breathing.   Pt heart rate was at 96 to 98 bpm.

## 2022-05-29 NOTE — ED Notes (Signed)
Informed EDP of pts temp.

## 2022-05-29 NOTE — ED Notes (Signed)
An After Visit Summary was printed and given to the patient. Discharge instructions given and no further questions at this time.  Pt leaving with family.  

## 2022-05-29 NOTE — Discharge Instructions (Signed)
Return for worsening of symptoms. °

## 2022-05-29 NOTE — ED Triage Notes (Signed)
Pt reports high bp reading at home and headache that started this morning.

## 2022-05-29 NOTE — ED Provider Notes (Signed)
[place] EMERGENCY DEPARTMENT AT [facility] Provider Note   CSN: [id number] Arrival date & time: 05/29/22  4098     History  Chief Complaint  Patient presents with   Hypertension    [name] is a 67 y.o. female.  Pt is a 67 yo female with pmhx significant for htn, osa, and gerd.  Pt said she woke up this am with chills and a little headache.  She feels nauseous. She denies any pain.  No known sick contacts.  Pt has not taken any of her meds today.  She said her bp was high at home.       Home Medications Prior to Admission medications   Medication Sig Start Date End Date Taking? Authorizing Provider  acetaminophen (TYLENOL) 500 MG tablet Take 2 tablets (1,000 mg total) by mouth every 6 (six) hours as needed. Patient taking differently: Take 1,000 mg by mouth every 6 (six) hours as needed for moderate pain. 08/23/21   Arby Barrette, MD  benzonatate (TESSALON) 100 MG capsule Take 1 capsule (100 mg total) by mouth 3 (three) times daily as needed for cough. 01/30/22   Zenia Resides, MD  Calcium 600-200 MG-UNIT tablet Take 1 tablet by mouth daily.    [provider]  dicyclomine (BENTYL) 20 MG tablet Take 1 tablet (20 mg total) by mouth 2 (two) times daily as needed for spasms. 08/24/21   Carroll Sage, PA-C  esomeprazole (NEXIUM) 40 MG capsule Take 40 mg by mouth daily. 07/31/21   [provider]  famotidine (PEPCID) 20 MG tablet Take 1 tablet (20 mg total) by mouth 2 (two) times daily. 08/23/21   Arby Barrette, MD  fluticasone (FLONASE) 50 MCG/ACT nasal spray Place 1-2 sprays into both nostrils daily. Patient taking differently: Place 1-2 sprays into both nostrils daily as needed for allergies. 05/28/20   Wieters, Hallie C, PA-C  Olmesartan-amLODIPine-HCTZ 40-5-25 MG TABS Take 1 tablet by mouth daily.    [provider]  ondansetron (ZOFRAN) 4 MG tablet Take 1 tablet (4 mg total) by mouth every 6 (six) hours. 08/24/21    Carroll Sage, PA-C      Allergies    Patient has no known allergies.    Review of Systems   Review of Systems  Constitutional:  Positive for chills.  Neurological:  Positive for headaches.  All other systems reviewed and are negative.   Physical Exam Updated Vital Signs BP 105/70   Pulse 89   Temp 98 F (36.7 C) (Oral)   Resp 17   Ht 5\' 4"  (1.626 m)   Wt 80.7 kg   SpO2 98%   BMI 30.55 kg/m  Physical Exam Vitals and nursing note reviewed.  Constitutional:      Appearance: Normal appearance.  HENT:     Head: Normocephalic and atraumatic.     Right Ear: External ear normal.     Left Ear: External ear normal.     Nose: Nose normal.     Mouth/Throat:     Mouth: Mucous membranes are dry.  Eyes:     Extraocular Movements: Extraocular movements intact.     Conjunctiva/sclera: Conjunctivae normal.     Pupils: Pupils are equal, round, and reactive to light.  Cardiovascular:     Rate and Rhythm: Regular rhythm. Tachycardia present.     Pulses: Normal pulses.     Heart sounds: Normal heart sounds.  Pulmonary:     Effort: Pulmonary effort is normal.  Breath sounds: Normal breath sounds.  Abdominal:     General: Abdomen is flat. Bowel sounds are normal.     Palpations: Abdomen is soft.  Musculoskeletal:        General: Normal range of motion.     Cervical back: Normal range of motion and neck supple.  Skin:    General: Skin is warm.     Capillary Refill: Capillary refill takes less than 2 seconds.  Neurological:     General: No focal deficit present.     Mental Status: She is alert and oriented to person, place, and time.  Psychiatric:        Mood and Affect: Mood normal.        Behavior: Behavior normal.     ED Results / Procedures / Treatments   Labs (all labs ordered are listed, but only abnormal results are displayed) Labs Reviewed  COMPREHENSIVE METABOLIC PANEL - Abnormal; Notable for the following components:      Result Value   Potassium 3.2  (*)    Glucose, Bld 172 (*)    All other components within normal limits  CBC WITH DIFFERENTIAL/PLATELET - Abnormal; Notable for the following components:   WBC 14.5 (*)    Hemoglobin 11.9 (*)    MCV 74.1 (*)    MCH 23.4 (*)    Neutro Abs 13.1 (*)    All other components within normal limits  URINALYSIS, ROUTINE W REFLEX MICROSCOPIC - Abnormal; Notable for the following components:   Hgb urine dipstick SMALL (*)    Protein, ur 30 (*)    Leukocytes,Ua TRACE (*)    Bacteria, UA RARE (*)    All other components within normal limits  CBG MONITORING, ED - Abnormal; Notable for the following components:   Glucose-Capillary 166 (*)    All other components within normal limits  RESP PANEL BY RT-PCR (RSV, FLU A&B, COVID)  RVPGX2  GROUP A STREP BY PCR    EKG EKG Interpretation  Date/Time:  Thursday May 29 2022 07:20:21 EDT Ventricular Rate:  119 PR Interval:  180 QRS Duration: 90 QT Interval:  315 QTC Calculation: 444 R Axis:   -15 Text Interpretation: Sinus tachycardia Left atrial enlargement Borderline left axis deviation Anterior infarct, old duplicate Confirmed by Jacalyn Lefevre [phone]) on 05/29/2022 12:53:07 PM  Radiology CT ABDOMEN PELVIS W CONTRAST  Result Date: 05/29/2022 CLINICAL DATA:  Abdominal pain EXAM: CT ABDOMEN AND PELVIS WITH CONTRAST TECHNIQUE: Multidetector CT imaging of the abdomen and pelvis was performed using the standard protocol following bolus administration of intravenous contrast. RADIATION DOSE REDUCTION: This exam was performed according to the departmental dose-optimization program which includes automated exposure control, adjustment of the mA and/or kV according to patient size and/or use of iterative reconstruction technique. CONTRAST:  OMNIPAQUE IOHEXOL 300 MG/ML  SOLN COMPARISON:  08/23/2021 FINDINGS: Lower chest: No acute findings are seen. Heart is enlarged in size. Small hiatal hernia is seen. Hepatobiliary: No focal abnormalities are seen in  liver. Liver measures 18.9 cm in length. There is no dilation of bile ducts. Gallbladder is unremarkable. Pancreas: No focal abnormalities are seen. Spleen: Unremarkable. Adrenals/Urinary Tract: Adrenals are unremarkable. There is no hydronephrosis. There is 3.4 cm smooth marginated fluid density lesion in the upper pole of right kidney suggesting renal cyst. There are no renal or ureteral stones. Urinary bladder is unremarkable. Stomach/Bowel: Small hiatal hernia is seen. Stomach is not distended. Small bowel loops are not dilated. There is a tubular air-filled structure in the  pericecal region suggesting normal appendix. Scattered diverticula are seen in colon. There is no evidence of focal acute diverticulitis. There is incomplete distention of the transverse colon. There is no pericolic stranding or fluid collection. Vascular/Lymphatic: Scattered arterial calcifications are seen. Reproductive: There are few small calcifications in uterus, possibly calcified uterine fibroids. Other: There is no ascites or pneumoperitoneum. Umbilical hernia containing fat is seen. Small ventral hernia containing fat is seen superior to the umbilicus. Musculoskeletal: No acute findings are seen. IMPRESSION: There is no evidence of intestinal obstruction or pneumoperitoneum. There is no hydronephrosis. Diverticulosis of colon without signs of focal diverticulitis. Small hiatal hernia. Right renal cyst. Other findings as described in the body of the report. Electronically Signed   By: Ernie Avena M.D.   On: 05/29/2022 11:50   DG Chest Portable 1 View  Result Date: 05/29/2022 CLINICAL DATA:  Fever, elevated blood pressure, and headache beginning this morning at home EXAM: PORTABLE CHEST 1 VIEW COMPARISON:  Portable exam 0748 hours compared to 08/23/2021 FINDINGS: Upper normal heart size. Mediastinal contours and pulmonary vascularity normal. Lungs clear. No acute infiltrate, pleural effusion, or pneumothorax. Osseous  structures unremarkable. IMPRESSION: No acute abnormalities. Electronically Signed   By: Ulyses Southward M.D.   On: 05/29/2022 07:58    Procedures Procedures    Medications Ordered in ED Medications  sodium chloride 0.9 % bolus 1,000 mL (0 mLs Intravenous Stopped 05/29/22 1330)  acetaminophen (TYLENOL) tablet 650 mg (650 mg Oral Given 05/29/22 0739)  ondansetron (ZOFRAN) injection 4 mg (4 mg Intravenous Given 05/29/22 0803)  ibuprofen (ADVIL) tablet 800 mg (800 mg Oral Given 05/29/22 0857)  iohexol (OMNIPAQUE) 300 MG/ML solution 100 mL (100 mLs Intravenous Contrast Given 05/29/22 1057)    ED Course/ Medical Decision Making/ A&P                             Medical Decision Making Amount and/or Complexity of Data Reviewed Labs: ordered. Radiology: ordered.  Risk OTC drugs. Prescription drug management.   This patient presents to the ED for concern of chills, this involves an extensive number of treatment options, and is a complaint that carries with it a high risk of complications and morbidity.  The differential diagnosis includes pna, covid/flu/rsv, uti   Co morbidities that complicate the patient evaluation  htn, osa, and gerd   Additional history obtained:  Additional history obtained from epic chart review External records from outside source obtained and reviewed including family   Lab Tests:  I Ordered, and personally interpreted labs.  The pertinent results include:  cbc with wbc elevated at 14.5, hgb low at 11.9 (chronic), covid/flu/rsv neg, strep neg, cmp with k sl low at 3.2, glucose elevated at 172, ua nl   Imaging Studies ordered:  I ordered imaging studies including cxr and ct abd/pelvis I independently visualized and interpreted imaging which showed  CXR: No acute abnormalities.  CT abd/pelvis: There is no evidence of intestinal obstruction or pneumoperitoneum.  There is no hydronephrosis.    Diverticulosis of colon without signs of focal diverticulitis.  Small  hiatal hernia. Right renal cyst.    Other findings as described in the body of the report.   I agree with the radiologist interpretation   Cardiac Monitoring:  The patient was maintained on a cardiac monitor.  I personally viewed and interpreted the cardiac monitored which showed an underlying rhythm of: st   Medicines ordered and prescription drug management:  I  ordered medication including tylenol/zofran/ivfs  for sx  Reevaluation of the patient after these medicines showed that the patient improved I have reviewed the patients home medicines and have made adjustments as needed   Test Considered:  ct   Critical Interventions:  Antipyretics/ivfs   Problem List / ED Course:  Fever:  unclear source.  Likely viral.  She is feeling much better.  She is able to ambulate without problems.  She is stable for d/c.  Return if worse.  F/u with pcp.   Reevaluation:  After the interventions noted above, I reevaluated the patient and found that they have :improved   Social Determinants of Health:  Lives at home   Dispostion:  After consideration of the diagnostic results and the patients response to treatment, I feel that the patent would benefit from discharge with outpatient f/u.          Final Clinical Impression(s) / ED Diagnoses Final diagnoses:  Febrile illness, acute    Rx / DC Orders ED Discharge Orders     None         Jacalyn Lefevre, MD 05/29/22 1335

## 2022-07-02 ENCOUNTER — Encounter (HOSPITAL_BASED_OUTPATIENT_CLINIC_OR_DEPARTMENT_OTHER): Payer: Self-pay | Admitting: Pulmonary Disease

## 2022-07-02 ENCOUNTER — Ambulatory Visit (INDEPENDENT_AMBULATORY_CARE_PROVIDER_SITE_OTHER): Payer: Medicare Other | Admitting: Pulmonary Disease

## 2022-07-02 VITALS — BP 120/68 | HR 80 | Ht 64.0 in | Wt 178.0 lb

## 2022-07-02 DIAGNOSIS — G4733 Obstructive sleep apnea (adult) (pediatric): Secondary | ICD-10-CM | POA: Diagnosis not present

## 2022-07-02 NOTE — Patient Instructions (Signed)
Will arrange for a home sleep study Will call to arrange for follow up after sleep study reviewed  

## 2022-07-02 NOTE — Progress Notes (Signed)
[name]  Chief Complaint  Patient presents with   Follow-up    Pt states she had not been wearing her cpap machine and states she is wanting to get back on it.    Constitutional:  BP 120/68 (BP Location: Right Arm, Patient Position: Sitting, Cuff Size: Normal)   Pulse 80   Ht 5\' 4"  (1.626 m)   Wt 178 lb (80.7 kg)   SpO2 99% Comment: RA  BMI 30.55 kg/m   Past Medical History:  HTN, GERD, Positive ANA, Pre-DM, HLD, Palpitations, Vit D deficiency  Past Surgical History:  She  has a past surgical history that includes Tonsillectomy.  Brief Summary:  [name] is a 67 y.o. female with obstructive sleep apnea.      Subjective:   Last seen in 2022.  She had a sleep study then that showed moderate to severe sleep apnea.  She was feeling depressed after her brother passed away and had trouble keeping up with her health issues.  She has notice more trouble with her sleep recently and realized she needed to take control of her health again.  She is snoring and wakes up feeling short of breath.  She is feeling more tired during the day.  She has a mouth guard for teeth grinding.  She didn't have trouble using this, but hasn't been consistent with this.  Physical Exam:   Appearance - well kempt   ENMT - no sinus tenderness, no oral exudate, no LAN, Mallampati 4 airway, no stridor, over bite  Respiratory - equal breath sounds bilaterally, no wheezing or rales  CV - s1s2 regular rate and rhythm, no murmurs  Ext - no clubbing, no edema  Skin - no rashes  Psych - normal mood and affect    Sleep Tests:  HST 06/24/17 >> AHI 45.3, SaO2 low 79% Auto CPAP 08/02/17 to 08/31/17 >> used on 30 of 30 nights with average 5 hrs 29 minutes.  Average AHI 2.1 with median CPAP 7 and 95 th percentile CPAP 11 cm H2O 07/19/20 >> AHI 29.1, SpO2 low 77%   Social History:  She  reports that she has never smoked. She has never used smokeless  tobacco. She reports current alcohol use. She reports that she does not use drugs.  Family History:  Her family history includes Breast cancer in her maternal aunt; Emphysema in her father; Lung cancer in her mother.    Discussion:  She has snoring, sleep disruption, apnea, and daytime sleepiness.  She has history of hypertension.  Previous sleep study showed severe sleep apnea.  I am concerned she still has clinically significant sleep apnea.  Assessment/Plan:   Obstructive sleep apnea. - will arrange for a home sleep study to assess current status of sleep apnea - reviewed different treatment options for sleep apnea - assuming she still has significant sleep apnea, would likely arrange for a new Resmed auto CPAP machine   Time Spent Involved in Patient Care on Day of Examination:  18 minutes  Follow up:   Patient Instructions  Will arrange for a home sleep study Will call to arrange for follow up after sleep study reviewed   Medication List:   Allergies as of 07/02/2022   No Known Allergies      Medication List        Accurate as of Jul 02, 2022  4:19 PM. If you have any questions, ask your nurse or doctor.  STOP taking these medications    acetaminophen 500 MG tablet Commonly known as: TYLENOL Stopped by: Coralyn Helling, MD   benzonatate 100 MG capsule Commonly known as: TESSALON Stopped by: Coralyn Helling, MD   Calcium 600-200 MG-UNIT tablet Stopped by: Coralyn Helling, MD   dicyclomine 20 MG tablet Commonly known as: BENTYL Stopped by: Coralyn Helling, MD   esomeprazole 40 MG capsule Commonly known as: NEXIUM Stopped by: Coralyn Helling, MD   famotidine 20 MG tablet Commonly known as: PEPCID Stopped by: Coralyn Helling, MD   ondansetron 4 MG tablet Commonly known as: ZOFRAN Stopped by: Coralyn Helling, MD       TAKE these medications    BIOTIN PO Take by mouth.   cholecalciferol 25 MCG (1000 UNIT) tablet Commonly known as: VITAMIN D3 Take 1,000  Units by mouth daily.   fluticasone 50 MCG/ACT nasal spray Commonly known as: FLONASE Place 1-2 sprays into both nostrils daily. What changed:  when to take this reasons to take this   Olmesartan-amLODIPine-HCTZ 40-5-25 MG Tabs Take 1 tablet by mouth daily.   omeprazole 40 MG capsule Commonly known as: PRILOSEC Take 40 mg by mouth daily.   potassium chloride 10 MEQ tablet Commonly known as: KLOR-CON Take 10 mEq by mouth daily.        Signature:  Coralyn Helling, MD [facility] Pulmonary/Critical Care Pager - [phone] 07/02/2022, 4:19 PM

## 2022-07-22 ENCOUNTER — Telehealth: Payer: Self-pay | Admitting: Pulmonary Disease

## 2022-07-22 NOTE — Telephone Encounter (Signed)
Patient would like results of sleep study. Patient phone number is [phone].

## 2022-07-23 NOTE — Telephone Encounter (Signed)
Please let her know her study is still being analyzed by [facility].  Will call her when results are available.

## 2022-07-23 NOTE — Telephone Encounter (Signed)
VS, please advise.  

## 2022-07-23 NOTE — Telephone Encounter (Signed)
Spoke with patient. Advised we are still waiting on sleep study results from [facility]. I told patient it can take up to 2 weeks to receive those but someone from our office would give her a call to go over results. She verbalized understanding. NFN

## 2022-07-24 ENCOUNTER — Ambulatory Visit (HOSPITAL_BASED_OUTPATIENT_CLINIC_OR_DEPARTMENT_OTHER): Payer: Medicare Other | Admitting: Pulmonary Disease

## 2022-07-28 ENCOUNTER — Telehealth: Payer: Self-pay | Admitting: Pulmonary Disease

## 2022-07-28 ENCOUNTER — Ambulatory Visit (INDEPENDENT_AMBULATORY_CARE_PROVIDER_SITE_OTHER): Payer: Medicare Other

## 2022-07-28 DIAGNOSIS — G4733 Obstructive sleep apnea (adult) (pediatric): Secondary | ICD-10-CM

## 2022-07-28 NOTE — Telephone Encounter (Signed)
HST 07/10/22 >> AHI 15.1, SpO2 low 77%   Please inform her that her sleep study shows moderate obstructive sleep apnea.  Please arrange for ROV with me or NP to discuss treatment options.

## 2022-08-06 NOTE — Telephone Encounter (Signed)
Sent pt mychart message w/ results, NFN att. 

## 2022-08-27 ENCOUNTER — Encounter (HOSPITAL_BASED_OUTPATIENT_CLINIC_OR_DEPARTMENT_OTHER): Payer: Self-pay

## 2022-08-27 ENCOUNTER — Encounter (HOSPITAL_BASED_OUTPATIENT_CLINIC_OR_DEPARTMENT_OTHER): Payer: Self-pay | Admitting: Pulmonary Disease

## 2022-08-27 ENCOUNTER — Ambulatory Visit (INDEPENDENT_AMBULATORY_CARE_PROVIDER_SITE_OTHER): Payer: Medicare Other | Admitting: Pulmonary Disease

## 2022-08-27 VITALS — BP 128/68 | HR 72 | Resp 16 | Ht 64.17 in | Wt 174.6 lb

## 2022-08-27 DIAGNOSIS — Z7189 Other specified counseling: Secondary | ICD-10-CM

## 2022-08-27 DIAGNOSIS — G4733 Obstructive sleep apnea (adult) (pediatric): Secondary | ICD-10-CM

## 2022-08-27 NOTE — Progress Notes (Signed)
[facility]  Chief Complaint  Patient presents with   Follow-up    She had second study recently and she is wondering if it needs to be calibrated.     Constitutional:  BP 128/68   Pulse 72   Resp 16   Ht 5' 4.17" (1.63 m)   Wt 174 lb 9.6 oz (79.2 kg)   SpO2 98%   BMI 29.81 kg/m   Past Medical History:  HTN, GERD, Positive ANA, Pre-DM, HLD, Palpitations, Vit D deficiency  Past Surgical History:  She  has a past surgical history that includes Tonsillectomy.  Brief Summary:  [name] is a 67 y.o. female with obstructive sleep apnea.      Subjective:   Her sleep study again showed moderate obstructive sleep apnea.  She is anxious to get started back on CPAP again.  She was using [employer] for her supplies.  She still has a CPAP machine, but it is more than 68 years old.  Physical Exam:   Appearance - well kempt   ENMT - no sinus tenderness, no oral exudate, no LAN, Mallampati 4 airway, no stridor, over bite  Respiratory - equal breath sounds bilaterally, no wheezing or rales  CV - s1s2 regular rate and rhythm, no murmurs  Ext - no clubbing, no edema  Skin - no rashes  Psych - normal mood and affect    Sleep Tests:  HST 06/24/17 >> AHI 45.3, SaO2 low 79% Auto CPAP 08/02/17 to 08/31/17 >> used on 30 of 30 nights with average 5 hrs 29 minutes.  Average AHI 2.1 with median CPAP 7 and 95 th percentile CPAP 11 cm H2O 07/19/20 >> AHI 29.1, SpO2 low 77%  HST 07/10/22 >> AHI 15.1, SpO2 low 77%   Cardiac Tests:  Echo 05/19/19 >> EF 60 to 65%, mild/mod LVH Cardiac CT 07/08/22 >> calcium score 74 (78 th percentile)  Social History:  She  reports that she has never smoked. She has never used smokeless tobacco. She reports current alcohol use. She reports that she does not use drugs.  Family History:  Her family history includes Breast cancer in her maternal aunt; Emphysema in her father; Lung cancer in her mother.      Assessment/Plan:   Obstructive sleep apnea. - reviewed her sleep study - discussed how sleep apnea can impact her health - treatment options reviewed - will arrange for a new auto CPAP 5 to 15 cm H2O - she would like to continue with [employer] for her DME   Time Spent Involved in Patient Care on Day of Examination:  25 minutes  Follow up:   Patient Instructions  Will arrange for new auto CPAP set up  Follow up in 4 months  Medication List:   Allergies as of 08/27/2022   No Known Allergies      Medication List        Accurate as of August 27, 2022 10:08 AM. If you have any questions, ask your nurse or doctor.          STOP taking these medications    omeprazole 40 MG capsule Commonly known as: PRILOSEC Stopped by: Coralyn Helling   potassium chloride 10 MEQ tablet Commonly known as: KLOR-CON Stopped by: Coralyn Helling       TAKE these medications    aspirin EC 81 MG tablet Take 81 mg by mouth daily. Swallow whole.   BIOTIN PO Take by mouth.   cholecalciferol 25  MCG (1000 UNIT) tablet Commonly known as: VITAMIN D3 Take 1,000 Units by mouth daily.   fluticasone 50 MCG/ACT nasal spray Commonly known as: FLONASE Place 1-2 sprays into both nostrils daily. What changed:  when to take this reasons to take this   hydrocortisone 25 MG suppository Commonly known as: ANUSOL-HC Place 25 mg rectally 2 (two) times daily as needed.   Olmesartan-amLODIPine-HCTZ 40-5-12.5 MG Tabs Take 1 tablet by mouth daily. What changed: Another medication with the same name was removed. Continue taking this medication, and follow the directions you see here. Changed by: Coralyn Helling   pravastatin 40 MG tablet Commonly known as: PRAVACHOL Take 40 mg by mouth daily.        Signature:  Coralyn Helling, MD [facility] Pulmonary/Critical Care Pager - [phone] 08/27/2022, 10:08 AM

## 2022-08-27 NOTE — Patient Instructions (Signed)
Will arrange for new auto CPAP set up  Follow up in 4 months

## 2023-02-17 ENCOUNTER — Other Ambulatory Visit: Payer: Self-pay | Admitting: Gastroenterology

## 2023-02-17 DIAGNOSIS — R748 Abnormal levels of other serum enzymes: Secondary | ICD-10-CM

## 2023-02-19 ENCOUNTER — Ambulatory Visit
Admission: RE | Admit: 2023-02-19 | Discharge: 2023-02-19 | Disposition: A | Discharge status: Home / Self Care | Payer: Medicare Other | Source: Ambulatory Visit | Attending: Gastroenterology | Admitting: Gastroenterology

## 2023-02-19 DIAGNOSIS — R748 Abnormal levels of other serum enzymes: Secondary | ICD-10-CM

## 2023-03-13 ENCOUNTER — Other Ambulatory Visit: Payer: Self-pay | Admitting: Critical Care Medicine

## 2023-03-13 ENCOUNTER — Other Ambulatory Visit: Payer: Self-pay | Admitting: Cardiology

## 2023-03-13 ENCOUNTER — Encounter: Payer: Self-pay | Admitting: Critical Care Medicine

## 2023-03-13 NOTE — Progress Notes (Signed)
 Pt has a pcp. Pt did not do a sdoh questionnaire. Pt does not smoke.

## 2023-03-13 NOTE — Progress Notes (Signed)
 Pt seen at health screen event  BP high  159/89  Recommend change Bp meds to olmesartan /hydrochlorothiazide 20/12.5 and take amlodipine 10mg  at bedtime

## 2023-03-14 LAB — LIPID PANEL W/O CHOL/HDL RATIO
Cholesterol, Total: 156 mg/dL (ref 100–199)
HDL: 64 mg/dL (ref >39)
LDL Chol Calc (NIH): 77 mg/dL (ref 0–99)
Triglycerides: 76 mg/dL (ref 0–149)
VLDL Cholesterol Cal: 15 mg/dL (ref 5–40)

## 2023-04-10 ENCOUNTER — Ambulatory Visit (HOSPITAL_BASED_OUTPATIENT_CLINIC_OR_DEPARTMENT_OTHER): Payer: Medicare Other | Admitting: Pulmonary Disease

## 2023-04-10 ENCOUNTER — Encounter (HOSPITAL_BASED_OUTPATIENT_CLINIC_OR_DEPARTMENT_OTHER): Payer: Self-pay | Admitting: Pulmonary Disease

## 2023-04-10 VITALS — BP 120/84 | HR 84 | Ht 64.1 in | Wt 171.8 lb

## 2023-04-10 DIAGNOSIS — G4733 Obstructive sleep apnea (adult) (pediatric): Secondary | ICD-10-CM

## 2023-04-10 NOTE — Progress Notes (Signed)
   Subjective:    Patient ID: [name], female    DOB: 14-Mar-1955, 68 y.o.   MRN: [id number]  HPI  68 y.o. female with obstructive sleep apnea.  New autoCPAP 08/2022     PMH : HTN, GERD, Positive ANA, Pre-DM, HLD, Palpitations, Vit D deficiency   The patient, previously under the care of Dr. Craige Cotta, presents for a routine follow-up for severe sleep apnea. She reports inconsistent use of her CPAP machine, initially struggling with discomfort due to the mask. However, after understanding the serious implications of untreated sleep apnea on her heart and brain health, she has made a concerted effort to increase usage. Over the past month, she has gradually increased her nightly usage from one hour to five hours. She expresses a desire to continue improving her CPAP adherence. The patient uses a nasal pillow mask and has found it more comfortable than previous masks. She also reports using the humidifier feature on her CPAP machine to prevent dryness.   CPAP download was reviewed which shows excellent control of events on auto settings 5 to 15 cm with average pressure of 8 and maximum pressure of 9 cm.  She has a mild leak.  Average usage is 3 hours per night  Significant tests/ events reviewed  HST 06/24/17 >> AHI 45.3, SaO2 low 79% Auto CPAP 08/02/17 to 08/31/17 >> used on 30 of 30 nights with average 5 hrs 29 minutes.  Average AHI 2.1 with median CPAP 7 and 95 th percentile CPAP 11 cm H2O 07/19/20 >> AHI 29.1, SpO2 low 77%  HST 07/10/22 >> AHI 15.1, SpO2 low 77%    Cardiac Tests:  Echo 05/19/19 >> EF 60 to 65%, mild/mod LVH Cardiac CT 07/08/22 >> calcium score 74 (78 th percentile)  Review of Systems neg for any significant sore throat, dysphagia, itching, sneezing, nasal congestion or excess/ purulent secretions, fever, chills, sweats, unintended wt loss, pleuritic or exertional cp, hempoptysis, orthopnea pnd or change in chronic leg swelling. Also denies presyncope, palpitations,  heartburn, abdominal pain, nausea, vomiting, diarrhea or change in bowel or urinary habits, dysuria,hematuria, rash, arthralgias, visual complaints, headache, numbness weakness or ataxia.     Objective:   Physical Exam  Gen. Pleasant, obese, in no distress ENT - no lesions, no post nasal drip Neck: No JVD, no thyromegaly, no carotid bruits Lungs: no use of accessory muscles, no dullness to percussion, decreased without rales or rhonchi  Cardiovascular: Rhythm regular, heart sounds  normal, no murmurs or gallops, no peripheral edema Musculoskeletal: No deformities, no cyanosis or clubbing , no tremors       Assessment & Plan:   Severe Obstructive Sleep Apnea Severe obstructive sleep apnea with 40 apnea events per hour, posing significant cardiovascular and neurological risks due to hypoxemia. CPAP therapy has reduced events to 1 per hour, indicating effective management. Initial compliance issues were due to lack of awareness about severity, but adherence has improved. She prefers a nasal pillow mask. Discussed potential future use of Inspire therapy if CPAP becomes ineffective or intolerable. - Adjust CPAP pressure settings from 5-15 cm H2O to 5-10 cm H2O to optimize therapy. - Coordinate with CPAP supplier, [employer], for remote pressure adjustment. - Encourage continued CPAP use to maintain effective treatment. - Educate on the importance of consistent CPAP use to prevent cardiovascular and neurological complications.

## 2023-04-10 NOTE — Addendum Note (Signed)
 Addended by: Amada Kingfisher on: 04/10/2023 11:39 AM   Modules accepted: Orders

## 2023-04-10 NOTE — Patient Instructions (Signed)
 Try to use your CPAP machine every night at least 4 to 6 hours  x change to auto CPAP 5 to 10 cm

## 2023-04-28 NOTE — Progress Notes (Unsigned)
 The patient attended a screening event on 03/13/23 where her BP screening results was 154/90, 149/87 and 159/89. At the event the patient noted she doesn't smoke, has no sdoh insecurities and has a pcp. Per chart review the pt does have a pcp, the last ov with pcp was 03/10/23. Chart review indicates that the pt has insurance. Chart review also indicates a future appt with pcp on 06/23/23. According to chart the pt had an office visit for hypertension on 03/17/23 post health equity event. At the event the pt was recommended by Dr Delford Field to change BP medsto olmesartan /hydrochlorothiazide 20/12.5 and take amlodipine 10mg  at bedtime. According to the chart the pt has since then had adjustments to BP meds. Pt is consistent with care. No additional Health equity team support indicated at this time.

## 2024-02-27 ENCOUNTER — Ambulatory Visit: Admission: EM | Admit: 2024-02-27 | Discharge: 2024-02-27 | Disposition: A | Discharge status: Home / Self Care

## 2024-02-27 DIAGNOSIS — L250 Unspecified contact dermatitis due to cosmetics: Secondary | ICD-10-CM

## 2024-02-27 MED ORDER — LORATADINE 10 MG PO TABS: 10.0000 mg | ORAL_TABLET | Freq: Every day | ORAL | 0 refills | Status: AC

## 2024-02-27 MED ORDER — HYDROCORTISONE 2.5 % EX LOTN: TOPICAL_LOTION | Freq: Two times a day (BID) | CUTANEOUS | 0 refills | Status: AC

## 2024-02-27 NOTE — Discharge Instructions (Signed)
 Use hydrocortisone  2 times a day and apply a thin layer of vaseline on top.  Only use steroid cream until rash resolves.  Discontinue use of perfume and skin serum.

## 2024-02-27 NOTE — ED Triage Notes (Addendum)
 [name] presents with irritation around her neck, states it may have came from perfume or serum she used X 2-3 days, treated with hydrocortisone  2.5% cream and Vaseline. It relieved it some from itching.

## 2024-02-27 NOTE — ED Provider Notes (Signed)
 " [facility]    CSN: [id number] Arrival date & time: 02/27/24  9187      History   Chief Complaint No chief complaint on file.   HPI [name] is a 69 y.o. female.   Pt presents today due to skin eruption and itching of neck for the past 2-3 days. Pt states that she does not know if it is due to skin serum or perfume she used. Pt states that she has been experiencing mild relief of symptoms with use of old steroid cream and vaseline.  The history is provided by the patient.    Past Medical History:  Diagnosis Date   GERD (gastroesophageal reflux disease)    Hypertension    OSA (obstructive sleep apnea) 06/25/2017    Patient Active Problem List   Diagnosis Date Noted   Morbid obesity (HCC) 09/10/2020   OSA (obstructive sleep apnea) 06/25/2017    Past Surgical History:  Procedure Laterality Date   TONSILLECTOMY      OB History   No obstetric history on file.      Home Medications    Prior to Admission medications  Medication Sig Start Date End Date Taking? Authorizing Provider  hydrocortisone  2.5 % lotion Apply topically 2 (two) times daily. 02/27/24  Yes Andra Krabbe C, PA-C  loratadine  (CLARITIN ) 10 MG tablet Take 1 tablet (10 mg total) by mouth daily. 02/27/24  Yes Andra Krabbe BROCKS, PA-C  aspirin EC 81 MG tablet Take 81 mg by mouth daily. Swallow whole.    [provider]  BIOTIN PO Take by mouth.    [provider]  cholecalciferol (VITAMIN D3) 25 MCG (1000 UNIT) tablet Take 1,000 Units by mouth daily.    [provider]  esomeprazole (NEXIUM) 40 MG capsule Take 40 mg by mouth daily at 12 noon.    [provider]  fluticasone  (FLONASE ) 50 MCG/ACT nasal spray Place 1-2 sprays into both nostrils daily. Patient taking differently: Place 1-2 sprays into both nostrils daily as needed for allergies. 05/28/20   Wieters, Hallie C, PA-C  hydrocortisone  (ANUSOL -HC) 25 MG suppository Place 25 mg rectally  2 (two) times daily as needed. 07/04/22   [provider]  Multiple Vitamin (MULTIVITAMIN) tablet Take 1 tablet by mouth daily.    [provider]  Olmesartan-amLODIPine-HCTZ 40-5-25 MG TABS Take 1 tablet by mouth daily. 08/16/22   [provider]  pravastatin (PRAVACHOL) 40 MG tablet Take 40 mg by mouth daily. 08/14/22   [provider]    Family History Family History  Problem Relation Age of Onset   Breast cancer Maternal Aunt    Lung cancer Mother    Emphysema Father     Social History Social History[1]   Allergies   Patient has no known allergies.   Review of Systems Review of Systems   Physical Exam Triage Vital Signs ED Triage Vitals  Encounter Vitals Group     BP 02/27/24 0948 135/81     Girls Systolic BP Percentile --      Girls Diastolic BP Percentile --      Boys Systolic BP Percentile --      Boys Diastolic BP Percentile --      Pulse Rate 02/27/24 0948 70     Resp 02/27/24 0948 16     Temp 02/27/24 0948 98.2 F (36.8 C)     Temp Source 02/27/24 0948 Oral     SpO2 02/27/24 0948 96 %  Weight 02/27/24 0945 171 lb (77.6 kg)     Height 02/27/24 0945 5' 4 (1.626 m)     Head Circumference --      Peak Flow --      Pain Score 02/27/24 0945 0     Pain Loc --      Pain Education --      Exclude from Growth Chart --    No data found.  Updated Vital Signs BP 135/81 (BP Location: Left Arm)   Pulse 70   Temp 98.2 F (36.8 C) (Oral)   Resp 16   Ht 5' 4 (1.626 m)   Wt 171 lb (77.6 kg)   SpO2 96%   BMI 29.35 kg/m   Visual Acuity Right Eye Distance:   Left Eye Distance:   Bilateral Distance:    Right Eye Near:   Left Eye Near:    Bilateral Near:     Physical Exam Vitals and nursing note reviewed.  Constitutional:      General: She is not in acute distress.    Appearance: Normal appearance. She is not ill-appearing, toxic-appearing or diaphoretic.  Eyes:     General: No scleral icterus. Cardiovascular:      Rate and Rhythm: Normal rate and regular rhythm.     Heart sounds: Normal heart sounds.  Pulmonary:     Effort: Pulmonary effort is normal. No respiratory distress.     Breath sounds: Normal breath sounds. No wheezing or rhonchi.  Skin:    General: Skin is warm.     Comments: No appreciable skin eruption noted   Neurological:     Mental Status: She is alert and oriented to person, place, and time.  Psychiatric:        Mood and Affect: Mood normal.        Behavior: Behavior normal.      UC Treatments / Results  Labs (all labs ordered are listed, but only abnormal results are displayed) Labs Reviewed - No data to display  EKG   Radiology No results found.  Procedures Procedures (including critical care time)  Medications Ordered in UC Medications - No data to display  Initial Impression / Assessment and Plan / UC Course  I have reviewed the triage vital signs and the nursing notes.  Pertinent labs & imaging results that were available during my care of the patient were reviewed by me and considered in my medical decision making (see chart for details).      Final Clinical Impressions(s) / UC Diagnoses   Final diagnoses:  Contact dermatitis due to cosmetics, unspecified contact dermatitis type     Discharge Instructions      Use hydrocortisone  2 times a day and apply a thin layer of vaseline on top.  Only use steroid cream until rash resolves.  Discontinue use of perfume and skin serum.     ED Prescriptions     Medication Sig Dispense Auth. Provider   hydrocortisone  2.5 % lotion Apply topically 2 (two) times daily. 59 mL Andra Krabbe C, PA-C   loratadine  (CLARITIN ) 10 MG tablet Take 1 tablet (10 mg total) by mouth daily. 30 tablet Andra Krabbe BROCKS, PA-C      PDMP not reviewed this encounter.    [1]  Social History Tobacco Use   Smoking status: Never   Smokeless tobacco: Never  Vaping Use   Vaping status: Never Used  Substance Use  Topics   Alcohol use: Not Currently   Drug use: Never  Andra Corean BROCKS, PA-C 02/27/24 1022  "
